# Patient Record
Sex: Female | Born: 1965 | Race: White | Hispanic: No | Marital: Married | Smoking: Former smoker
Health system: Southern US, Community
[De-identification: ages and names within clinical notes are randomized; demographics above are authoritative.]

## PROBLEM LIST (undated history)

## (undated) DIAGNOSIS — R200 Anesthesia of skin: Secondary | ICD-10-CM

## (undated) DIAGNOSIS — I700 Atherosclerosis of aorta: Secondary | ICD-10-CM

## (undated) DIAGNOSIS — M62838 Other muscle spasm: Secondary | ICD-10-CM

## (undated) DIAGNOSIS — E559 Vitamin D deficiency, unspecified: Secondary | ICD-10-CM

## (undated) DIAGNOSIS — E11319 Type 2 diabetes mellitus with unspecified diabetic retinopathy without macular edema: Secondary | ICD-10-CM

## (undated) DIAGNOSIS — K859 Acute pancreatitis without necrosis or infection, unspecified: Secondary | ICD-10-CM

## (undated) DIAGNOSIS — R740 Nonspecific elevation of levels of transaminase and lactic acid dehydrogenase [LDH]: Secondary | ICD-10-CM

## (undated) DIAGNOSIS — E669 Obesity, unspecified: Secondary | ICD-10-CM

## (undated) DIAGNOSIS — E113299 Type 2 diabetes mellitus with mild nonproliferative diabetic retinopathy without macular edema, unspecified eye: Secondary | ICD-10-CM

## (undated) DIAGNOSIS — E785 Hyperlipidemia, unspecified: Secondary | ICD-10-CM

## (undated) DIAGNOSIS — R7401 Elevation of levels of liver transaminase levels: Secondary | ICD-10-CM

## (undated) DIAGNOSIS — R232 Flushing: Secondary | ICD-10-CM

## (undated) HISTORY — DX: Atherosclerosis of aorta: I70.0

## (undated) HISTORY — DX: Elevation of levels of liver transaminase levels: R74.01

## (undated) HISTORY — DX: Nonspecific elevation of levels of transaminase and lactic acid dehydrogenase (ldh): R74.0

## (undated) HISTORY — DX: Anesthesia of skin: R20.0

## (undated) HISTORY — DX: Obesity, unspecified: E66.9

## (undated) HISTORY — DX: Type 2 diabetes mellitus with unspecified diabetic retinopathy without macular edema: E11.319

## (undated) HISTORY — DX: Other muscle spasm: M62.838

## (undated) HISTORY — DX: Vitamin D deficiency, unspecified: E55.9

## (undated) HISTORY — DX: Type 2 diabetes mellitus with mild nonproliferative diabetic retinopathy without macular edema, unspecified eye: E11.3299

## (undated) HISTORY — DX: Hyperlipidemia, unspecified: E78.5

## (undated) HISTORY — DX: Acute pancreatitis without necrosis or infection, unspecified: K85.90

## (undated) HISTORY — DX: Flushing: R23.2

---
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
## 1988-03-28 HISTORY — PX: TUBAL LIGATION: SHX77

## 2004-08-06 ENCOUNTER — Other Ambulatory Visit: Admission: RE | Admit: 2004-08-06 | Discharge: 2004-08-06 | Payer: Self-pay | Admitting: Obstetrics & Gynecology

## 2006-06-01 ENCOUNTER — Ambulatory Visit (HOSPITAL_COMMUNITY): Admission: RE | Admit: 2006-06-01 | Discharge: 2006-06-01 | Payer: Self-pay | Admitting: Family Medicine

## 2006-06-05 ENCOUNTER — Encounter (HOSPITAL_COMMUNITY): Admission: RE | Admit: 2006-06-05 | Discharge: 2006-07-05 | Payer: Self-pay | Admitting: Family Medicine

## 2007-03-26 ENCOUNTER — Emergency Department (HOSPITAL_COMMUNITY): Admission: EM | Admit: 2007-03-26 | Discharge: 2007-03-26 | Payer: Self-pay | Admitting: Emergency Medicine

## 2008-04-15 ENCOUNTER — Ambulatory Visit (HOSPITAL_COMMUNITY): Admission: RE | Admit: 2008-04-15 | Discharge: 2008-04-15 | Payer: Self-pay | Admitting: Family Medicine

## 2010-11-27 ENCOUNTER — Emergency Department: Payer: Self-pay | Admitting: Emergency Medicine

## 2010-12-31 LAB — CBC
Hemoglobin: 12.6
MCV: 87.4
RBC: 4.27
RDW: 15.2

## 2010-12-31 LAB — LIPASE, BLOOD: Lipase: 42

## 2010-12-31 LAB — DIFFERENTIAL
Eosinophils Relative: 1
Lymphocytes Relative: 30
Lymphs Abs: 1.6
Monocytes Absolute: 0.5
Monocytes Relative: 8

## 2010-12-31 LAB — POCT CARDIAC MARKERS
CKMB, poc: <1 — ABNORMAL LOW
Troponin i, poc: <0.05

## 2011-03-29 HISTORY — PX: CERVICAL FUSION: SHX112

## 2011-08-29 DIAGNOSIS — IMO0001 Reserved for inherently not codable concepts without codable children: Secondary | ICD-10-CM | POA: Insufficient documentation

## 2012-01-28 ENCOUNTER — Emergency Department: Payer: Self-pay | Admitting: Emergency Medicine

## 2012-01-31 DIAGNOSIS — M5010 Cervical disc disorder with radiculopathy, unspecified cervical region: Secondary | ICD-10-CM | POA: Insufficient documentation

## 2012-04-20 ENCOUNTER — Emergency Department: Payer: Self-pay | Admitting: Emergency Medicine

## 2012-10-05 DIAGNOSIS — F419 Anxiety disorder, unspecified: Secondary | ICD-10-CM | POA: Insufficient documentation

## 2012-10-08 ENCOUNTER — Encounter: Payer: Self-pay | Admitting: Orthopedic Surgery

## 2012-10-26 ENCOUNTER — Encounter: Payer: Self-pay | Admitting: Orthopedic Surgery

## 2013-03-26 ENCOUNTER — Emergency Department: Payer: Self-pay | Admitting: Emergency Medicine

## 2013-03-26 LAB — COMPREHENSIVE METABOLIC PANEL
Albumin: 4.3 g/dL (ref 3.4–5.0)
Alkaline Phosphatase: 83 U/L
Anion Gap: 6 — ABNORMAL LOW (ref 7–16)
Bilirubin,Total: 0.5 mg/dL (ref 0.2–1.0)
Calcium, Total: 10 mg/dL (ref 8.5–10.1)
Creatinine: 0.83 mg/dL (ref 0.60–1.30)
EGFR (African American): >60
Glucose: 135 mg/dL — ABNORMAL HIGH (ref 65–99)
Osmolality: 271 (ref 275–301)
Sodium: 134 mmol/L — ABNORMAL LOW (ref 136–145)
Total Protein: 8.3 g/dL — ABNORMAL HIGH (ref 6.4–8.2)

## 2013-03-26 LAB — URINALYSIS, COMPLETE
Bilirubin,UR: NEGATIVE
Blood: NEGATIVE
Ph: 5 (ref 4.5–8.0)
Squamous Epithelial: 7

## 2013-03-26 LAB — CBC WITH DIFFERENTIAL/PLATELET
Eosinophil #: 0.4 10*3/uL (ref 0.0–0.7)
Eosinophil %: 5.5 %
HCT: 43.1 % (ref 35.0–47.0)
Lymphocyte #: 3.2 10*3/uL (ref 1.0–3.6)
MCHC: 33.4 g/dL (ref 32.0–36.0)
Neutrophil %: 44.1 %

## 2013-04-06 DIAGNOSIS — N390 Urinary tract infection, site not specified: Secondary | ICD-10-CM | POA: Insufficient documentation

## 2013-04-06 DIAGNOSIS — R10A1 Flank pain, right side: Secondary | ICD-10-CM | POA: Insufficient documentation

## 2013-04-06 DIAGNOSIS — R109 Unspecified abdominal pain: Secondary | ICD-10-CM | POA: Insufficient documentation

## 2013-07-09 ENCOUNTER — Ambulatory Visit: Payer: Self-pay | Admitting: Family Medicine

## 2013-07-09 LAB — CREATININE, SERUM
Creatinine: 0.83 mg/dL (ref 0.60–1.30)
EGFR (African American): >60

## 2013-07-09 LAB — BUN: BUN: 13 mg/dL (ref 7–18)

## 2013-07-29 ENCOUNTER — Ambulatory Visit: Payer: Self-pay | Admitting: Podiatry

## 2014-07-16 DIAGNOSIS — E785 Hyperlipidemia, unspecified: Secondary | ICD-10-CM | POA: Insufficient documentation

## 2014-07-16 DIAGNOSIS — E113299 Type 2 diabetes mellitus with mild nonproliferative diabetic retinopathy without macular edema, unspecified eye: Secondary | ICD-10-CM | POA: Insufficient documentation

## 2014-07-16 DIAGNOSIS — E669 Obesity, unspecified: Secondary | ICD-10-CM | POA: Insufficient documentation

## 2014-07-16 DIAGNOSIS — E11319 Type 2 diabetes mellitus with unspecified diabetic retinopathy without macular edema: Secondary | ICD-10-CM | POA: Insufficient documentation

## 2014-09-15 ENCOUNTER — Other Ambulatory Visit: Payer: Self-pay | Admitting: Family Medicine

## 2014-09-15 NOTE — Telephone Encounter (Signed)
Routing to provider  

## 2014-09-15 NOTE — Telephone Encounter (Signed)
E-Fax came through for refill: Rx: sertraline (ZOLOFT) 100 MG tablet Rx in basket

## 2014-09-17 MED ORDER — SERTRALINE HCL 100 MG PO TABS: 100.0000 mg | ORAL_TABLET | Freq: Every day | ORAL | Status: DC

## 2015-04-07 ENCOUNTER — Other Ambulatory Visit: Payer: Self-pay | Admitting: Family Medicine

## 2015-05-07 ENCOUNTER — Ambulatory Visit (INDEPENDENT_AMBULATORY_CARE_PROVIDER_SITE_OTHER): Payer: Managed Care, Other (non HMO) | Admitting: Family Medicine

## 2015-05-07 ENCOUNTER — Encounter: Payer: Self-pay | Admitting: Family Medicine

## 2015-05-07 VITALS — BP 130/86 | HR 99 | Temp 97.2°F | Ht 69.0 in | Wt 222.0 lb

## 2015-05-07 DIAGNOSIS — Z0000 Encounter for general adult medical examination without abnormal findings: Secondary | ICD-10-CM

## 2015-05-07 DIAGNOSIS — R55 Syncope and collapse: Secondary | ICD-10-CM | POA: Insufficient documentation

## 2015-05-07 DIAGNOSIS — Z1239 Encounter for other screening for malignant neoplasm of breast: Secondary | ICD-10-CM | POA: Diagnosis not present

## 2015-05-07 DIAGNOSIS — Z862 Personal history of diseases of the blood and blood-forming organs and certain disorders involving the immune mechanism: Secondary | ICD-10-CM | POA: Diagnosis not present

## 2015-05-07 DIAGNOSIS — R159 Full incontinence of feces: Secondary | ICD-10-CM | POA: Insufficient documentation

## 2015-05-07 DIAGNOSIS — E11319 Type 2 diabetes mellitus with unspecified diabetic retinopathy without macular edema: Secondary | ICD-10-CM

## 2015-05-07 DIAGNOSIS — E669 Obesity, unspecified: Secondary | ICD-10-CM

## 2015-05-07 DIAGNOSIS — Z23 Encounter for immunization: Secondary | ICD-10-CM

## 2015-05-07 DIAGNOSIS — Z114 Encounter for screening for human immunodeficiency virus [HIV]: Secondary | ICD-10-CM | POA: Insufficient documentation

## 2015-05-07 DIAGNOSIS — R351 Nocturia: Secondary | ICD-10-CM | POA: Insufficient documentation

## 2015-05-07 LAB — UA/M W/RFLX CULTURE, ROUTINE
Bilirubin, UA: NEGATIVE
KETONES UA: NEGATIVE
LEUKOCYTES UA: NEGATIVE
Nitrite, UA: NEGATIVE
PROTEIN UA: NEGATIVE
RBC UA: NEGATIVE
Specific Gravity, UA: 1.015 (ref 1.005–1.030)
Urobilinogen, Ur: 0.2 mg/dL (ref 0.2–1.0)
pH, UA: 5.5 (ref 5.0–7.5)

## 2015-05-07 LAB — BAYER DCA HB A1C WAIVED: HB A1C (BAYER DCA - WAIVED): >14 % — ABNORMAL HIGH (ref <7.0)

## 2015-05-07 LAB — MICROALBUMIN, URINE WAIVED
Creatinine, Urine Waived: 50 mg/dL (ref 10–300)
Microalb, Ur Waived: 80 mg/L — ABNORMAL HIGH (ref 0–19)

## 2015-05-07 MED ORDER — LISINOPRIL 5 MG PO TABS
5.0000 mg | ORAL_TABLET | Freq: Every day | ORAL | Status: DC
Start: 2015-05-07 — End: 2015-06-03

## 2015-05-07 MED ORDER — METFORMIN HCL ER 500 MG PO TB24: 500.0000 mg | ORAL_TABLET | Freq: Every day | ORAL | Status: DC

## 2015-05-07 MED ORDER — SERTRALINE HCL 50 MG PO TABS: ORAL_TABLET | ORAL | Status: DC

## 2015-05-07 MED ORDER — INSULIN DEGLUDEC 100 UNIT/ML ~~LOC~~ SOPN: 10.0000 [IU] | PEN_INJECTOR | Freq: Every day | SUBCUTANEOUS | Status: DC

## 2015-05-07 MED ORDER — LINAGLIPTIN 5 MG PO TABS: 5.0000 mg | ORAL_TABLET | Freq: Every day | ORAL | Status: DC

## 2015-05-07 NOTE — Progress Notes (Signed)
BP 130/86 mmHg  Pulse 99  Temp(Src) 97.2 F (36.2 C)  Ht 5\' 9"  (1.753 m)  Wt 222 lb (100.699 kg)  BMI 32.77 kg/m2  SpO2 96%   Subjective:    Patient ID: Breanna Rogers, female    DOB: 1965-10-06, 50 y.o.   MRN: [id number]  HPI: LETRICIA Rogers is a 50 y.o. female  Chief Complaint  Patient presents with  . Annual Exam    She has been without insurance and has been out of all of her meds for several months. Mammogram card given.  . Diabetes    She has DM eye exam scheduled for 05/18/15. Pneumonia vaccine is UTD.  . Lab    She is willing to be screened for HIV.   She has not taken her medicine in months; she quit her job, lost her insurance; just got her insurance back this month Her stomach was hurting a year ago, has happened twice; her stomach started hurting; went to the bathroom and she couldn't hold it and it just came; like no control over her bowels; another time she was getting his hair cut and it hit her stomach and she got up to ask where the bathroom was and she was incontinent before she could get there; that has happened twice; then last week, she was cooking supper and was peeling her shrimp and was real light-headed; grabbed some water and sat down and felt badly, thought she was going to pass out; she had her head laying on the table; last week; her sweetie said she was white as a ghost; laid on the bed for 15-20 minutes and felt better   No blood in the stool; never had a colonoscopy; her sister had a colonoscopy last year and she had polyps, she is older; no family hx of colon cancer; no inflammatory bowel disease; stool caliber is the same; goes every day, sometimes twice a day; consistency changes, sometimes more loose; not sure if follows diet; no pain with BMs; does have abdominal cramping; she thinks she has lost some weight; appetite is fair; no nausea or vomiting  Her sugars are extremely high; 300s and 400s; not taking any of her diabetes medicines; no chest  pain; gums have been bleeding; no hematuria; some headaches, vision stays blurry; sees eye doctor Feb 20th; might have yeast infection, vaginal irritation; no urinary frequency, nocturia; blood sugar was 323 this morning; metformin upsets her stomach  Relevant past medical, surgical, family and social history reviewed and updated as indicated. Interim medical history since our last visit reviewed. Allergies and medications reviewed and updated.  Review of Systems Per HPI unless specifically indicated above     Objective:    BP 130/86 mmHg  Pulse 99  Temp(Src) 97.2 F (36.2 C)  Ht 5\' 9"  (1.753 m)  Wt 222 lb (100.699 kg)  BMI 32.77 kg/m2  SpO2 96%  Wt Readings from Last 3 Encounters:  05/07/15 222 lb (100.699 kg)  07/16/14 214 lb (97.07 kg)    Physical Exam  Constitutional: She appears well-developed and well-nourished. No distress.  HENT:  Head: Normocephalic and atraumatic.  Eyes: EOM are normal. No scleral icterus.  Neck: No thyromegaly present.  Cardiovascular: Normal rate, regular rhythm and normal heart sounds.   No murmur heard. Pulmonary/Chest: Effort normal and breath sounds normal. No respiratory distress. She has no wheezes.  Abdominal: Soft. Bowel sounds are normal. She exhibits no distension.  Musculoskeletal: Normal range of motion. She exhibits no edema.  Neurological: She is alert. She exhibits normal muscle tone.  Skin: Skin is warm and dry. She is not diaphoretic. No pallor.  Psychiatric: She has a normal mood and affect. Her behavior is normal. Judgment and thought content normal.    Diabetic Foot Form - Detailed   Diabetic Foot Exam - detailed  Diabetic Foot exam was performed with the following findings:  Yes 05/07/2015  8:43 AM  Visual Foot Exam completed.:  Yes  Are the toenails long?:  No  Are the toenails thick?:  Yes  Are the toenails ingrown?:  No    Pulse Foot Exam completed.:  Yes  Right Dorsalis Pedis:  Present Left Dorsalis Pedis:  Present   Sensory Foot Exam Completed.:  Yes  Semmes-Weinstein Monofilament Test  R Site 1-Great Toe:  Pos L Site 1-Great Toe:  Pos  R Site 4:  Pos L Site 4:  Pos  R Site 5:  Pos L Site 5:  Pos    Comments:  Microtrauma effects noted right great toenail with discoloration; toenails are thickened on right great toe, right 2nd toe, left great and 2nd and 5th toes; other toenails appear normal, pink, not thick        Assessment & Plan:   Problem List Items Addressed This Visit      Cardiovascular and Mediastinum   Pre-syncope    Possibly due to extremely high blood sugars; start insulin today; close f/u tomrrow; other labs today; to ER if recurs      Relevant Medications   lisinopril (PRINIVIL,ZESTRIL) 5 MG tablet     Endocrine   Diabetes mellitus type 2 with retinopathy (HCC) - Primary    Check A1c today, urine microalbumin; A1c shows way out of control diabetes, likely as the result of her noncompliance; start insulin; close f/u tomorrow      Relevant Medications   metFORMIN (GLUCOPHAGE XR) 500 MG 24 hr tablet   lisinopril (PRINIVIL,ZESTRIL) 5 MG tablet   Insulin Degludec (TRESIBA FLEXTOUCH) 100 UNIT/ML SOPN   Other Relevant Orders   Bayer DCA Hb A1c Waived (Completed)   Microalbumin, Urine Waived (Completed)     Other   Obesity    Check thyroid, lipids      Relevant Medications   metFORMIN (GLUCOPHAGE XR) 500 MG 24 hr tablet   Insulin Degludec (TRESIBA FLEXTOUCH) 100 UNIT/ML SOPN   Hx of iron deficiency anemia    Check CBC today      Nocturia   Relevant Orders   UA/M w/rflx Culture, Routine (Completed)   Screening for HIV (human immunodeficiency virus)   Relevant Orders   HIV antibody   Breast cancer screening   Relevant Orders   MM DIGITAL SCREENING BILATERAL   Fecal incontinence    Refer to GI to see if large polyp or mass otherwise interfering with outlet, sphincters; doe snot sound like cauda equina based on history      Relevant Orders   Ambulatory referral  to Gastroenterology    Other Visit Diagnoses    Needs flu shot        Encounter for immunization        Relevant Orders    Flu Vaccine QUAD 36+ mos IM    Preventative health care        Relevant Orders    CBC With Differential (Completed)    Comprehensive metabolic panel (Completed)    Lipid Panel w/o Chol/HDL Ratio (Completed)       Follow up plan: Return in about  1 day (around 05/08/2015) for uncontrolled diabetes.  An after-visit summary was printed and given to the patient at [facility].  Please see the patient instructions which may contain other information and recommendations beyond what is mentioned above in the assessment and plan.  Orders Placed This Encounter  Procedures  . MM DIGITAL SCREENING BILATERAL  . Flu Vaccine QUAD 36+ mos IM  . Bayer DCA Hb A1c Waived  . CBC With Differential  . UA/M w/rflx Culture, Routine  . Microalbumin, Urine Waived  . Comprehensive metabolic panel  . Lipid Panel w/o Chol/HDL Ratio  . HIV antibody  . HIV antibody  . Ambulatory referral to Gastroenterology   Meds ordered this encounter  Medications  . metFORMIN (GLUCOPHAGE XR) 500 MG 24 hr tablet    Sig: Take 1 tablet (500 mg total) by mouth daily with supper.    Dispense:  30 tablet    Refill:  2  . DISCONTD: sertraline (ZOLOFT) 50 MG tablet    Sig: One-half of a pill by mouth daily x 6 days, then one whole pill daily    Dispense:  27 tablet    Refill:  0  . DISCONTD: linagliptin (TRADJENTA) 5 MG TABS tablet    Sig: Take 1 tablet (5 mg total) by mouth daily. Reported on 05/07/2015    Dispense:  30 tablet    Refill:  5  . lisinopril (PRINIVIL,ZESTRIL) 5 MG tablet    Sig: Take 1 tablet (5 mg total) by mouth daily.    Dispense:  30 tablet    Refill:  0  . Insulin Degludec (TRESIBA FLEXTOUCH) 100 UNIT/ML SOPN    Sig: Inject 10 Units into the skin daily.    Dispense:  1 pen    Refill:  0

## 2015-05-07 NOTE — Assessment & Plan Note (Addendum)
Check A1c today, urine microalbumin; A1c shows way out of control diabetes, likely as the result of her noncompliance; start insulin; close f/u tomorrow

## 2015-05-07 NOTE — Patient Instructions (Addendum)
You received the flu shot today; it should protect you against the flu virus over the coming months; it will take about two weeks for antibodies to develop; do try to stay away from hospitals, nursing homes, and daycares during peak flu season; taking extra vitamin C daily during flu season may help you avoid getting sick Start back on metformin just once every evening Start back on Tradjenta daily Start back on sertraline daily Start new medicine for kidney protection We'll have you return tomorrow at about this time for your next insulin injection If you have any questions at all about the insulin over the weekend, do NOT give it if unsure and come in on Monday, or go to your pharmacist and they can teach you as well Start back on aspirin 81 mg daily We'll refer you to the gastroenterologist Do follow-up with your eye doctor Consider either vicks vapor rub or dilute vinegar on affected nails and use separate nail clippers to prevent spread to healthy nails Really watch diet Please do call to schedule your mammogram; the number to schedule one at either [facility] or [facility] is [phone]   How and Where to Give Subcutaneous Insulin Injections, Adult People with type 1 diabetes must take insulin since their bodies do not make it. People with type 2 diabetes may require insulin. There are many different types of insulin as well as other injectable diabetes medicines that are meant to be injected into the fat layer under your skin. The type of insulin or injectable diabetes medicine you take may determine how many injections you give yourself and when to take the injections.  CHOOSING A SITE FOR INJECTION Insulin absorption varies from site to site. As with any injectable medication it is best for the insulin to be injected within the same body region. However, do not inject the insulin in the same spot each time. Rotating the spots you give your injections will  prevent inflammation or tissue breakdown. There are four main regions that can be used for injections. The regions include the:  Abdomen (preferred region, especially for non-insulin injectable diabetes medicine).  Front and upper outer sides of thighs.  Back of upper arm.  Buttocks. USING A SYRINGE AND VIAL Drawing up insulin: single insulin dose 1. Wash your hands with soap and water. 2. Gently roll the insulin bottle (vial) between your hands to mix it. Do not shake the vial. 3. Clean the top rubber part of the vial with an alcohol wipe. Be sure that the plastic pop-top has been removed on newer vials. 4. Remove the plastic cover from the needle on the syringe. Do not let the needle touch anything. 5. Pull the plunger back to draw air into the syringe. The air should be the same amount as the insulin dose. 6. Push the needle through the rubber on the top of the vial. Do not turn the vial over. 7. Push the plunger in all the way to put the air into the vial. 8. Leave the needle in the vial and turn the vial and syringe upside down. 9. Pull down slowly on the plunger, drawing the amount of insulin you need into the syringe. 10. Look for air bubbles in the syringe. You may need to push the plunger up and down 2 to 3 times to slowly get rid of any air bubbles in the syringe. 11. Pull back the plunger to get your correct dose. 12. Remove the needle from the vial.  13. Use an alcohol wipe to clean the area of the body to be injected. 14. Pinch up 1 inch of skin and hold it. 15. Put the needle straight into the skin (90-degree angle). Put the needle in as far as it will go (to the hub). The needle may need to be injected at a 45-degree angle in small adults with little fat. 16. When the needle is in, you can let go of your skin. 17. Push the plunger down all the way to inject the insulin. 18. Pull the needle straight out of the skin. 19. Press the alcohol wipe over the spot where you gave  your injection. Keep it there for a few seconds. Do not rub the area. 20. Do not put the plastic cover back on the needle. Drawing up insulin: mixing 2 insulins 1. Wash your hands with soap and water. 2. Gently roll the vial of "cloudy" insulin between your hands or rotate the vial from top to bottom to mix. 3. Clean the top of both vials with an alcohol wipe. Be sure that the plastic pop-top lid has been removed on newer vials. 4. Pull air into the syringe to equal the dose of "cloudy" insulin. 5. Stick the needle into the "cloudy" insulin vial and inject the air. Be sure to keep the vial upright. 6. Remove the needle from the "cloudy" insulin vial. 7. Pull air into the syringe to equal the dose of "clear" insulin. 8. Stick the needle into the "clear" insulin vial and inject the air. 9. Leave the needle in the "clear" insulin vial and turn the vial upside down. 10. Pull down on the plunger and slowly draw into the syringe the number of units of "clear" insulin desired. 11. Look for air bubbles in the syringe. You may need to push the plunger up and down 2 to 3 times to slowly get rid of any air bubbles in the syringe. 12. Remove the needle from the "clear" insulin vial. 13. Stick the needle into the "cloudy" insulin vial. Do not inject any of the "clear" insulin into the "cloudy" vial. 14. Turn the "cloudy" vial upside down and pull the plunger down to the number of units that equals the total number of units of "clear" and "cloudy" insulins. 15. Remove the needle from the "cloudy" insulin vial. 16. Use an alcohol wipe to clean the area of the body to be injected. 17. Put the needle straight into the skin (90-degree angle). Put the needle in as far as it will go (to the hub). The needle may need to be injected at a 45-degree angle in small adults with little fat. 18. When the needle is in, you can let go of your skin. 19. Push the plunger down all the way to inject the insulin. 20. Pull the  needle straight out of the skin. 21. Press the alcohol wipe over the spot where you gave your injection. Keep it there for a few seconds. Do not rub the area. 22. Do not put the plastic cover back on the needle. USING INSULIN PENS 1. Wash your hands with soap and water. 2. If you are using the "cloudy" insulin, roll the pen between your palms several times or rotate the pen top to bottom several times. 3. Remove the insulin pen cap. 4. Clean the rubber stopper of the cartridge with an alcohol wipe. 5. Remove the protective paper tab from the disposable needle. 6. Screw the needle onto the pen. 7. Remove the outer  plastic needle cover. 8. Remove the inner plastic needle cover. 9. Prime the insulin pen by turning the button (dial) to 2 units. Hold the pen with the needle pointing up, and push the dial on the opposite end until a drop of insulin appears at the needle tip. If no insulin appears, repeat this step. 10. Dial the number of units of insulin you will inject. 11. Use an alcohol wipe to clean the area of the body to be injected. 12. Pinch up 1 inch of skin and hold it. 13. Put the needle straight into the skin (90-degree angle). 14. Push the dial down to push the insulin into the fat tissue. 15. Count to 10 slowly. Then, remove the needle from the fat tissue. 16. Carefully replace the larger outer plastic needle cover over the needle and unscrew the capped needle. THROWING AWAY SUPPLIES  Discard used needles in a puncture proof sharps disposal container. Follow disposal regulations for the area where you live.  Vials and empty disposable pens may be thrown away in the regular trash.   This information is not intended to replace advice given to you by your health care provider. Make sure you discuss any questions you have with your health care provider.   Document Released: 06/04/2003 Document Revised: 04/04/2014 Document Reviewed: 08/21/2012 Elsevier Interactive Patient Education  [employer].

## 2015-05-07 NOTE — Assessment & Plan Note (Signed)
Check thyroid, lipids

## 2015-05-08 ENCOUNTER — Ambulatory Visit: Payer: Managed Care, Other (non HMO) | Admitting: Family Medicine

## 2015-05-08 LAB — CBC WITH DIFFERENTIAL
BASOS: 1 %
Basophils Absolute: 0.1 10*3/uL (ref 0.0–0.2)
EOS (ABSOLUTE): 0.9 10*3/uL — ABNORMAL HIGH (ref 0.0–0.4)
EOS: 14 %
HEMATOCRIT: 44.4 % (ref 34.0–46.6)
HEMOGLOBIN: 14.7 g/dL (ref 11.1–15.9)
IMMATURE GRANS (ABS): 0 10*3/uL (ref 0.0–0.1)
Immature Granulocytes: 0 %
LYMPHS: 30 %
Lymphocytes Absolute: 2 10*3/uL (ref 0.7–3.1)
MCH: 29.5 pg (ref 26.6–33.0)
MCHC: 33.1 g/dL (ref 31.5–35.7)
MCV: 89 fL (ref 79–97)
MONOCYTES: 7 %
Monocytes Absolute: 0.4 10*3/uL (ref 0.1–0.9)
NEUTROS ABS: 3.3 10*3/uL (ref 1.4–7.0)
Neutrophils: 48 %
RBC: 4.98 x10E6/uL (ref 3.77–5.28)
RDW: 13.5 % (ref 12.3–15.4)
WBC: 6.8 10*3/uL (ref 3.4–10.8)

## 2015-05-08 LAB — LIPID PANEL W/O CHOL/HDL RATIO
Cholesterol, Total: 239 mg/dL — ABNORMAL HIGH (ref 100–199)
HDL: 42 mg/dL (ref >39)
LDL Calculated: 144 mg/dL — ABNORMAL HIGH (ref 0–99)
Triglycerides: 266 mg/dL — ABNORMAL HIGH (ref 0–149)
VLDL Cholesterol Cal: 53 mg/dL — ABNORMAL HIGH (ref 5–40)

## 2015-05-08 LAB — COMPREHENSIVE METABOLIC PANEL
A/G RATIO: 1.5 (ref 1.1–2.5)
ALBUMIN: 4.6 g/dL (ref 3.5–5.5)
ALT: 25 IU/L (ref 0–32)
AST: 21 IU/L (ref 0–40)
Alkaline Phosphatase: 91 IU/L (ref 39–117)
BILIRUBIN TOTAL: 0.6 mg/dL (ref 0.0–1.2)
BUN / CREAT RATIO: 18 (ref 9–23)
BUN: 13 mg/dL (ref 6–24)
CALCIUM: 9.9 mg/dL (ref 8.7–10.2)
CHLORIDE: 93 mmol/L — AB (ref 96–106)
CO2: 23 mmol/L (ref 18–29)
Creatinine, Ser: 0.74 mg/dL (ref 0.57–1.00)
GFR, EST AFRICAN AMERICAN: 110 mL/min/{1.73_m2} (ref >59)
GFR, EST NON AFRICAN AMERICAN: 95 mL/min/{1.73_m2} (ref >59)
Globulin, Total: 3 g/dL (ref 1.5–4.5)
Glucose: 351 mg/dL — ABNORMAL HIGH (ref 65–99)
POTASSIUM: 4.5 mmol/L (ref 3.5–5.2)
Sodium: 137 mmol/L (ref 134–144)
TOTAL PROTEIN: 7.6 g/dL (ref 6.0–8.5)

## 2015-05-08 LAB — HIV ANTIBODY (ROUTINE TESTING W REFLEX): HIV SCREEN 4TH GENERATION: NONREACTIVE

## 2015-05-12 ENCOUNTER — Telehealth: Payer: Self-pay

## 2015-05-12 MED ORDER — SITAGLIPTIN PHOSPHATE 100 MG PO TABS: 100.0000 mg | ORAL_TABLET | Freq: Every day | ORAL | Status: DC

## 2015-05-12 NOTE — Telephone Encounter (Signed)
Patient's Prior Authorization for [facility] was denied.   Reason: Formularies must have a contradiction or reaction ([place], [place]) first.

## 2015-05-12 NOTE — Telephone Encounter (Signed)
Let pt know we're going to try Januvia instead; new Rx sent to pharmacy

## 2015-05-13 ENCOUNTER — Encounter: Payer: Self-pay | Admitting: Family Medicine

## 2015-05-13 NOTE — Telephone Encounter (Signed)
Patient notified

## 2015-05-14 MED ORDER — COLESEVELAM HCL 625 MG PO TABS: 1875.0000 mg | ORAL_TABLET | Freq: Two times a day (BID) | ORAL | Status: DC

## 2015-05-18 NOTE — Telephone Encounter (Signed)
Please check on GI referral Patient sent me a message, she has not heard back Thanks

## 2015-05-19 NOTE — Telephone Encounter (Signed)
Referral was generated to [facility] in [place].

## 2015-05-21 MED ORDER — SERTRALINE HCL 50 MG PO TABS: 75.0000 mg | ORAL_TABLET | Freq: Every day | ORAL | Status: DC

## 2015-05-21 NOTE — Telephone Encounter (Signed)
Yes, referral was generated, but patient has not heard anything back per her MyChart note to me Please contact Pat Patrick and patient and make sure appt is made and she's aware; thank you

## 2015-05-21 NOTE — Addendum Note (Signed)
Addended by: Hiyab Nhem, Satira Anis on: 05/21/2015 11:19 AM   Modules accepted: Orders

## 2015-05-21 NOTE — Telephone Encounter (Signed)
They have tried to call patient so far 1x per referral.  They have to referral and are working on it. Have reached out to the patient and will continue to.

## 2015-05-23 NOTE — Assessment & Plan Note (Signed)
Check CBC today.  

## 2015-05-23 NOTE — Assessment & Plan Note (Signed)
Refer to GI to see if large polyp or mass otherwise interfering with outlet, sphincters; doe snot sound like cauda equina based on history

## 2015-05-23 NOTE — Assessment & Plan Note (Signed)
Possibly due to extremely high blood sugars; start insulin today; close f/u tomrrow; other labs today; to ER if recurs

## 2015-05-24 ENCOUNTER — Encounter: Payer: Self-pay | Admitting: Gastroenterology

## 2015-05-24 NOTE — Telephone Encounter (Signed)
error 

## 2015-06-03 ENCOUNTER — Other Ambulatory Visit: Payer: Self-pay | Admitting: Family Medicine

## 2015-06-03 DIAGNOSIS — E11319 Type 2 diabetes mellitus with unspecified diabetic retinopathy without macular edema: Secondary | ICD-10-CM

## 2015-06-03 MED ORDER — LISINOPRIL 5 MG PO TABS: 5.0000 mg | ORAL_TABLET | Freq: Every day | ORAL | Status: DC

## 2015-06-03 MED ORDER — SERTRALINE HCL 50 MG PO TABS: 75.0000 mg | ORAL_TABLET | Freq: Every day | ORAL | Status: DC

## 2015-06-03 NOTE — Telephone Encounter (Signed)
Patient needs an appt please for her diabetes; I want to see how her sugars are doing and how her mood is doing on higher dose of sertraline appt within the week please

## 2015-06-04 NOTE — Telephone Encounter (Signed)
Patient has appt 06/12/15

## 2015-06-12 ENCOUNTER — Encounter: Payer: Self-pay | Admitting: Family Medicine

## 2015-06-12 ENCOUNTER — Ambulatory Visit (INDEPENDENT_AMBULATORY_CARE_PROVIDER_SITE_OTHER): Payer: Managed Care, Other (non HMO) | Admitting: Family Medicine

## 2015-06-12 VITALS — BP 119/77 | HR 102 | Temp 98.7°F | Wt 228.0 lb

## 2015-06-12 DIAGNOSIS — E785 Hyperlipidemia, unspecified: Secondary | ICD-10-CM | POA: Insufficient documentation

## 2015-06-12 DIAGNOSIS — E669 Obesity, unspecified: Secondary | ICD-10-CM | POA: Diagnosis not present

## 2015-06-12 DIAGNOSIS — E11319 Type 2 diabetes mellitus with unspecified diabetic retinopathy without macular edema: Secondary | ICD-10-CM

## 2015-06-12 MED ORDER — COLESEVELAM HCL 625 MG PO TABS: 625.0000 mg | ORAL_TABLET | Freq: Two times a day (BID) | ORAL | Status: DC

## 2015-06-12 MED ORDER — METFORMIN HCL ER 500 MG PO TB24: ORAL_TABLET | ORAL | Status: DC

## 2015-06-12 MED ORDER — SERTRALINE HCL 100 MG PO TABS: 100.0000 mg | ORAL_TABLET | Freq: Every day | ORAL | Status: DC

## 2015-06-12 NOTE — Assessment & Plan Note (Addendum)
Suspect insulin as part of cause of weight gain since she is trying so hard; encouragement given; titrate up metformin, and will hope to decrease insulin as sugars improve

## 2015-06-12 NOTE — Progress Notes (Signed)
BP 119/77 mmHg  Pulse 102  Temp(Src) 98.7 F (37.1 C)  Wt 228 lb (103.42 kg)  SpO2 97%   Subjective:    Patient ID: Breanna Rogers, female    DOB: [birth year], 50 y.o.   MRN: [id number]  HPI: Breanna Rogers is a 50 y.o. female  Chief Complaint  Patient presents with  . Diabetes    follow up, she has eye exam scheduled for next month  . Hyperlipidemia    follow up and labs, she is only taking 1 tab BID of the Welchol   Patient had lost her insurance was lost to follow-up for many months, off of medicine; she is back on now and her blood sugars are much, much better; blood sugars 140 after work sometimes; lowest blood sugar over last 1-2 weeks 140; highest blood sugar in the last week or two was 293 after cheesecake; has really cut out sweets for the most part; has sugar-free Jello and pudding; higher sugars in the morning than when she goes to bed; diet drinks; half sweet and half unsweet tea; no abdominal pain or nausea  She has gone up to 75 mg of sertraline; taking for hot flashes; also having some anxiety; she does not think she is depressed; not down and crying or tearful; does get short-fused; so happy in relationship and happy with job  Cholesterol; currently taking just one Welchol twice a day; not much bacon or sausage or any of that; if she has a salad, she does the balsalmic vinegar, not the heavy salad dressing  Relevant past medical, surgical, family and social history reviewed and updated as indicated Past Medical History  Diagnosis Date  . Diabetes mellitus type 2 with retinopathy ([place])   . 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 Hyperlipidemia   . Obesity   . Vitamin D deficiency   . Elevated transaminase level   . Hot flashes   . Spasm of muscle   . Numbness of foot    Past Surgical History  Procedure Laterality Date  . Tonsillectomy and adenoidectomy  1976  . Tubal ligation  1990  . Cervical fusion  2013   C5-7 at [facility], (ACDF C5-6 with removal of hardware)   Family History  Problem Relation Age of Onset  . Cancer Mother     cervical  . Asthma Mother   . Diabetes Mother   . Heart disease Mother   . Hyperlipidemia Mother   . Heart disease Father   . Hyperlipidemia Father   . Hypertension Father   . Diabetes Sister   . Thyroid disease Sister   . Cancer Sister   . Diabetes Brother   . Hypertension Brother   . Stroke Neg Hx   . COPD Sister    Interim medical history since our last visit reviewed. Allergies and medications reviewed and updated.  Review of Systems Per HPI unless specifically indicated above     Objective:    BP 119/77 mmHg  Pulse 102  Temp(Src) 98.7 F (37.1 C)  Wt 228 lb (103.42 kg)  SpO2 97%  Wt Readings from Last 3 Encounters:  06/12/15 228 lb (103.42 kg)  05/07/15 222 lb (100.699 kg)  07/16/14 214 lb (97.07 kg)    Physical Exam  Constitutional: She appears well-developed and well-nourished. No distress.  Weight gain noted  HENT:  Head: Normocephalic and atraumatic.  Eyes: EOM are normal. No scleral icterus.  Neck: No thyromegaly  present.  Cardiovascular: Normal rate, regular rhythm and normal heart sounds.   No murmur heard. Rate under 100 during auscultation  Pulmonary/Chest: Effort normal and breath sounds normal. No respiratory distress. She has no wheezes.  Abdominal: Soft. Bowel sounds are normal. She exhibits no distension.  Musculoskeletal: Normal range of motion. She exhibits no edema.  Neurological: She is alert. She exhibits normal muscle tone.  Skin: Skin is warm and dry. She is not diaphoretic. No pallor.  Psychiatric: She has a normal mood and affect. Her behavior is normal. Judgment and thought content normal.    Diabetic Foot Form - Detailed   Diabetic Foot Exam - detailed  Diabetic Foot exam was performed with the following findings:  Yes 06/12/2015  4:30 PM  Visual Foot Exam completed.:  Yes  Can the patient see the bottom of  their feet?:  Yes  Are the toenails long?:  No  Are the toenails thick?:  No  Are the toenails ingrown?:  No    Pulse Foot Exam completed.:  Yes  Right Dorsalis Pedis:  Present Left Dorsalis Pedis:  Present  Semmes-Weinstein Monofilament Test  R Site 1-Great Toe:  Pos L Site 1-Great Toe:  Pos  R Site 4:  Pos L Site 4:  Pos  R Site 5:  Pos L Site 5:  Pos        Results for orders placed or performed in visit on 05/07/15  Bayer DCA Hb A1c Waived  Result Value Ref Range   Bayer DCA Hb A1c Waived >14.0 (H) <7.0 %  CBC With Differential  Result Value Ref Range   WBC 6.8 3.4 - 10.8 x10E3/uL   RBC 4.98 3.77 - 5.28 x10E6/uL   Hemoglobin 14.7 11.1 - 15.9 g/dL   Hematocrit 44.4 34.0 - 46.6 %   MCV 89 79 - 97 fL   MCH 29.5 26.6 - 33.0 pg   MCHC 33.1 31.5 - 35.7 g/dL   RDW 13.5 12.3 - 15.4 %   Neutrophils 48 %   Lymphs 30 %   Monocytes 7 %   Eos 14 %   Basos 1 %   Neutrophils Absolute 3.3 1.4 - 7.0 x10E3/uL   Lymphocytes Absolute 2.0 0.7 - 3.1 x10E3/uL   Monocytes Absolute 0.4 0.1 - 0.9 x10E3/uL   EOS (ABSOLUTE) 0.9 (H) 0.0 - 0.4 x10E3/uL   Basophils Absolute 0.1 0.0 - 0.2 x10E3/uL   Immature Granulocytes 0 %   Immature Grans (Abs) 0.0 0.0 - 0.1 x10E3/uL  UA/M w/rflx Culture, Routine  Result Value Ref Range   Specific Gravity, UA 1.015 1.005 - 1.030   pH, UA 5.5 5.0 - 7.5   Color, UA Yellow Yellow   Appearance Ur Clear Clear   Leukocytes, UA Negative Negative   Protein, UA Negative Negative/Trace   Glucose, UA 3+ (A) Negative   Ketones, UA Negative Negative   RBC, UA Negative Negative   Bilirubin, UA Negative Negative   Urobilinogen, Ur 0.2 0.2 - 1.0 mg/dL   Nitrite, UA Negative Negative  Microalbumin, Urine Waived  Result Value Ref Range   Microalb, Ur Waived 80 (H) 0 - 19 mg/L   Creatinine, Urine Waived 50 10 - 300 mg/dL   Microalb/Creat Ratio 30-300 (H) <30 mg/g  Comprehensive metabolic panel  Result Value Ref Range   Glucose 351 (H) 65 - 99 mg/dL   BUN 13 6 - 24  mg/dL   Creatinine, Ser 0.74 0.57 - 1.00 mg/dL   GFR calc non Af Wyvonnia Lora  95 >59 mL/min/1.73   GFR calc Af Amer 110 >59 mL/min/1.73   BUN/Creatinine Ratio 18 9 - 23   Sodium 137 134 - 144 mmol/L   Potassium 4.5 3.5 - 5.2 mmol/L   Chloride 93 (L) 96 - 106 mmol/L   CO2 23 18 - 29 mmol/L   Calcium 9.9 8.7 - 10.2 mg/dL   Total Protein 7.6 6.0 - 8.5 g/dL   Albumin 4.6 3.5 - 5.5 g/dL   Globulin, Total 3.0 1.5 - 4.5 g/dL   Albumin/Globulin Ratio 1.5 1.1 - 2.5   Bilirubin Total 0.6 0.0 - 1.2 mg/dL   Alkaline Phosphatase 91 39 - 117 IU/L   AST 21 0 - 40 IU/L   ALT 25 0 - 32 IU/L  Lipid Panel w/o Chol/HDL Ratio  Result Value Ref Range   Cholesterol, Total 239 (H) 100 - 199 mg/dL   Triglycerides 266 (H) 0 - 149 mg/dL   HDL 42 >39 mg/dL   VLDL Cholesterol Cal 53 (H) 5 - 40 mg/dL   LDL Calculated 144 (H) 0 - 99 mg/dL  HIV antibody  Result Value Ref Range   HIV Screen 4th Generation wRfx Non Reactive Non Reactive      Assessment & Plan:   Problem List Items Addressed This Visit      Endocrine   Diabetes mellitus type 2 with retinopathy ([place]) - Primary    Patient had been lost to f/u; did not have insurance; now back on medicines; blood sugars much better; titrate up metformin; will hope to be able to bring down the insulin; also on Welchol to help sugars as well; she is eating much better; weight loss would also help; foot exam by MD today      Relevant Medications   aspirin EC 81 MG tablet   metFORMIN (GLUCOPHAGE XR) 500 MG 24 hr tablet     Other   Obesity    Suspect insulin as part of cause of weight gain since she is trying so hard; encouragement given; titrate up metformin, and will hope to decrease insulin as sugars improve      Relevant Medications   metFORMIN (GLUCOPHAGE XR) 500 MG 24 hr tablet   Dyslipidemia    She did not tolerate statin previously; now on Welchol; consider rechallenge with statin in May; labs in May (fasting)      Relevant Medications   colesevelam  (WELCHOL) 625 MG tablet      Follow up plan: Return in about 8 weeks (around 08/04/2015) for fasting labs and visit; [facility].  Meds ordered this encounter  Medications  . cetirizine (ZYRTEC) 10 MG tablet    Sig: Take 10 mg by mouth daily.  Marland Kitchen aspirin EC 81 MG tablet    Sig: Take 81 mg by mouth daily.  . metFORMIN (GLUCOPHAGE XR) 500 MG 24 hr tablet    Sig: Two pills by mouth every evening Mar 17-21, then three pills every evening Mar 22-26, then four pills every evening    Dispense:  110 tablet    Refill:  0  . sertraline (ZOLOFT) 100 MG tablet    Sig: Take 1 tablet (100 mg total) by mouth daily.    Dispense:  30 tablet    Refill:  11    Changing dose  . colesevelam (WELCHOL) 625 MG tablet    Sig: Take 1 tablet (625 mg total) by mouth 2 (two) times daily with a meal. Recheck labs in 6 weeks    Dispense:  60  tablet    Refill:  5    Changing dose   An after-visit summary was printed and given to the patient at [place].  Please see the patient instructions which may contain other information and recommendations beyond what is mentioned above in the assessment and plan.

## 2015-06-12 NOTE — Patient Instructions (Addendum)
Increase your metformin gradually by one pill every five days to four pills every night, start with two pills tonight and take two pills nightly March 17-22, then three pills nightly Mar 23-27, then four pills nightly Increase sertraline to 100 mg daily As sugars come down, we'll decrease and then stop your insulin (work with me on that), call me and we'll adjust We'll see you in early May then for labs and visit, but call sooner if needed

## 2015-06-25 NOTE — Assessment & Plan Note (Signed)
She did not tolerate statin previously; now on Welchol; consider rechallenge with statin in May; labs in May (fasting)

## 2015-06-25 NOTE — Assessment & Plan Note (Signed)
Patient had been lost to f/u; did not have insurance; now back on medicines; blood sugars much better; titrate up metformin; will hope to be able to bring down the insulin; also on Welchol to help sugars as well; she is eating much better; weight loss would also help; foot exam by MD today

## 2015-06-26 ENCOUNTER — Other Ambulatory Visit: Payer: Self-pay | Admitting: Family Medicine

## 2015-06-26 DIAGNOSIS — Z5181 Encounter for therapeutic drug level monitoring: Secondary | ICD-10-CM

## 2015-06-26 NOTE — Telephone Encounter (Signed)
Please ask patient to have a BMP done first week of April if she doesn't mind; we didn't talk about it at her appt, but I'd like to monitor her kidney function and K+ on the lisinopril; we'll still do the glucose and cholesterol panel in May as we discussed; she does not need to see me personally, just lab drawn (non-fasting) Rx sent as requested

## 2015-07-08 ENCOUNTER — Ambulatory Visit (INDEPENDENT_AMBULATORY_CARE_PROVIDER_SITE_OTHER): Payer: Managed Care, Other (non HMO) | Admitting: Family Medicine

## 2015-07-08 ENCOUNTER — Encounter: Payer: Self-pay | Admitting: Family Medicine

## 2015-07-08 VITALS — BP 110/80 | HR 93 | Temp 98.4°F | Resp 14 | Wt 227.0 lb

## 2015-07-08 DIAGNOSIS — E11319 Type 2 diabetes mellitus with unspecified diabetic retinopathy without macular edema: Secondary | ICD-10-CM

## 2015-07-08 DIAGNOSIS — E785 Hyperlipidemia, unspecified: Secondary | ICD-10-CM | POA: Diagnosis not present

## 2015-07-08 DIAGNOSIS — Z5181 Encounter for therapeutic drug level monitoring: Secondary | ICD-10-CM

## 2015-07-08 DIAGNOSIS — R809 Proteinuria, unspecified: Secondary | ICD-10-CM | POA: Insufficient documentation

## 2015-07-08 DIAGNOSIS — F320 Major depressive disorder, single episode, mild: Secondary | ICD-10-CM | POA: Diagnosis not present

## 2015-07-08 MED ORDER — SERTRALINE HCL 100 MG PO TABS: 150.0000 mg | ORAL_TABLET | Freq: Every day | ORAL | Status: DC

## 2015-07-08 MED ORDER — ATORVASTATIN CALCIUM 10 MG PO TABS: 10.0000 mg | ORAL_TABLET | Freq: Every day | ORAL | Status: DC

## 2015-07-08 MED ORDER — BLOOD GLUCOSE MONITOR KIT: PACK | Status: DC

## 2015-07-08 NOTE — Assessment & Plan Note (Signed)
Increase sertraline from 100 mg to 150 mg daily; call if any problems prior to next appt

## 2015-07-08 NOTE — Assessment & Plan Note (Signed)
Will check sgpt at f/u in May on the new statin

## 2015-07-08 NOTE — Assessment & Plan Note (Signed)
She is willing to try statin again; will start lower dose 10 mg atorvastatin instead of 40 mg at first; recheck lipids and sgpt in May at next visit; consider CK if significant muscle aches; goal LDL under 100; she did not tolerate welchol due to GI side effects; healthy eating encouraged, diet low in saturated fats and more whole grains

## 2015-07-08 NOTE — Assessment & Plan Note (Signed)
Patient's sugars sound to be under much better control; due for A1c next month; will hopefully be able to stop the insulin soon; continue to work on diet, healthy eating; check feet every night; foot exam by MD today

## 2015-07-08 NOTE — Progress Notes (Signed)
BP 110/80 mmHg  Pulse 93  Temp(Src) 98.4 F (36.9 C) (Oral)  Resp 14  Wt 227 lb (102.967 kg)  SpO2 94%   Subjective:    Patient ID: Breanna Rogers, female    DOB: 19-Jul-1965, 50 y.o.   MRN: [id number]  HPI: Breanna Rogers is a 50 y.o. female  Chief Complaint  Patient presents with  . Medication Refill  . Diabetes    Checks glucose 2x per day low-140, high-190's  . Hypertension  . Hyperlipidemia    patient states has quit taking whelchol  . Depression   Type 2 diabetes; mornings fasting are 140-160; no lows; also checking in the afternoon and before supper; 140-ish; doing a better job with eating; checking feet every night; no problems with sores; has a little numbness under toes; next eye exam is coming up next week; up to four pills metformin; using ten units of insulin daily  She did not tolerate Welchol, upset stomach; hardly eats any eggs; not much processed pork; gets whole grains, multigrain bread; she had taken statins before and got achy but she is willing to try one again; sister had same issue; took statin, got achy, off for a while, then took them again and she did fine  HTN; controlled today  Depression; mood has improved a little bit, but there is room for improvement; no dark thoughts; no HI/SI; no side effects from the sertraline  Relevant past medical, surgical, family and social history reviewed Past Medical History  Diagnosis Date  . Diabetes mellitus type 2 with retinopathy ([place])   . 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 Hyperlipidemia   . Obesity   . Vitamin D deficiency   . Elevated transaminase level   . Hot flashes   . Spasm of muscle   . Numbness of foot    Past Surgical History  Procedure Laterality Date  . Tonsillectomy and adenoidectomy  1976  . Tubal ligation  1990  . Cervical fusion  2013    C5-7 at [facility], (ACDF C5-6 with removal of hardware)   Family History  Problem Relation Age  of Onset  . Cancer Mother     cervical  . Asthma Mother   . Diabetes Mother   . Heart disease Mother   . Hyperlipidemia Mother   . Heart disease Father   . Hyperlipidemia Father   . Hypertension Father   . Diabetes Sister   . Thyroid disease Sister   . Cancer Sister   . Diabetes Brother   . Hypertension Brother   . Stroke Neg Hx   . COPD Sister    Social History  Substance Use Topics  . Smoking status: Former Smoker -- 1.00 packs/day for 35 years    Types: Cigarettes    Quit date: 03/28/2010  . Smokeless tobacco: Never Used  . Alcohol Use: No   S/P BTL for contraception Interim medical history since our last visit reviewed. Allergies and medications reviewed and updated.  Review of Systems  Per HPI unless specifically indicated above     Objective:    BP 110/80 mmHg  Pulse 93  Temp(Src) 98.4 F (36.9 C) (Oral)  Resp 14  Wt 227 lb (102.967 kg)  SpO2 94%  Wt Readings from Last 3 Encounters:  07/08/15 227 lb (102.967 kg)  06/12/15 228 lb (103.42 kg)  05/07/15 222 lb (100.699 kg)    Physical Exam  Constitutional: She appears well-developed  and well-nourished. No distress.  Weight down one pound  HENT:  Head: Normocephalic and atraumatic.  Eyes: EOM are normal. No scleral icterus.  Neck: No thyromegaly present.  Cardiovascular: Normal rate, regular rhythm and normal heart sounds.   No murmur heard. Pulmonary/Chest: Effort normal and breath sounds normal. No respiratory distress. She has no wheezes.  Abdominal: Soft. She exhibits no distension.  Musculoskeletal: Normal range of motion. She exhibits no edema.  Neurological: She is alert.  Skin: Skin is warm and dry. She is not diaphoretic. No pallor.  Psychiatric: She has a normal mood and affect. Her behavior is normal. Judgment and thought content normal.   Diabetic Foot Form - Detailed   Diabetic Foot Exam - detailed  Diabetic Foot exam was performed with the following findings:  Yes 07/08/2015  9:41 AM    Visual Foot Exam completed.:  Yes  Are the toenails long?:  No  Are the toenails thick?:  No  Are the toenails ingrown?:  No  Normal Range of Motion:  Yes    Pulse Foot Exam completed.:  Yes  Right Dorsalis Pedis:  Present Left Dorsalis Pedis:  Present  Sensory Foot Exam Completed.:  Yes  Swelling:  No  Semmes-Weinstein Monofilament Test  R Site 1-Great Toe:  Pos L Site 1-Great Toe:  Pos  R Site 4:  Pos L Site 4:  Pos  R Site 5:  Pos L Site 5:  Pos       Results for orders placed or performed in visit on 05/07/15  Bayer DCA Hb A1c Waived  Result Value Ref Range   Bayer DCA Hb A1c Waived >14.0 (H) <7.0 %  CBC With Differential  Result Value Ref Range   WBC 6.8 3.4 - 10.8 x10E3/uL   RBC 4.98 3.77 - 5.28 x10E6/uL   Hemoglobin 14.7 11.1 - 15.9 g/dL   Hematocrit 44.4 34.0 - 46.6 %   MCV 89 79 - 97 fL   MCH 29.5 26.6 - 33.0 pg   MCHC 33.1 31.5 - 35.7 g/dL   RDW 13.5 12.3 - 15.4 %   Neutrophils 48 %   Lymphs 30 %   Monocytes 7 %   Eos 14 %   Basos 1 %   Neutrophils Absolute 3.3 1.4 - 7.0 x10E3/uL   Lymphocytes Absolute 2.0 0.7 - 3.1 x10E3/uL   Monocytes Absolute 0.4 0.1 - 0.9 x10E3/uL   EOS (ABSOLUTE) 0.9 (H) 0.0 - 0.4 x10E3/uL   Basophils Absolute 0.1 0.0 - 0.2 x10E3/uL   Immature Granulocytes 0 %   Immature Grans (Abs) 0.0 0.0 - 0.1 x10E3/uL  UA/M w/rflx Culture, Routine  Result Value Ref Range   Specific Gravity, UA 1.015 1.005 - 1.030   pH, UA 5.5 5.0 - 7.5   Color, UA Yellow Yellow   Appearance Ur Clear Clear   Leukocytes, UA Negative Negative   Protein, UA Negative Negative/Trace   Glucose, UA 3+ (A) Negative   Ketones, UA Negative Negative   RBC, UA Negative Negative   Bilirubin, UA Negative Negative   Urobilinogen, Ur 0.2 0.2 - 1.0 mg/dL   Nitrite, UA Negative Negative  Microalbumin, Urine Waived  Result Value Ref Range   Microalb, Ur Waived 80 (H) 0 - 19 mg/L   Creatinine, Urine Waived 50 10 - 300 mg/dL   Microalb/Creat Ratio 30-300 (H) <30 mg/g   Comprehensive metabolic panel  Result Value Ref Range   Glucose 351 (H) 65 - 99 mg/dL   BUN 13 6 -  24 mg/dL   Creatinine, Ser 0.74 0.57 - 1.00 mg/dL   GFR calc non Af Amer 95 >59 mL/min/1.73   GFR calc Af Amer 110 >59 mL/min/1.73   BUN/Creatinine Ratio 18 9 - 23   Sodium 137 134 - 144 mmol/L   Potassium 4.5 3.5 - 5.2 mmol/L   Chloride 93 (L) 96 - 106 mmol/L   CO2 23 18 - 29 mmol/L   Calcium 9.9 8.7 - 10.2 mg/dL   Total Protein 7.6 6.0 - 8.5 g/dL   Albumin 4.6 3.5 - 5.5 g/dL   Globulin, Total 3.0 1.5 - 4.5 g/dL   Albumin/Globulin Ratio 1.5 1.1 - 2.5   Bilirubin Total 0.6 0.0 - 1.2 mg/dL   Alkaline Phosphatase 91 39 - 117 IU/L   AST 21 0 - 40 IU/L   ALT 25 0 - 32 IU/L  Lipid Panel w/o Chol/HDL Ratio  Result Value Ref Range   Cholesterol, Total 239 (H) 100 - 199 mg/dL   Triglycerides 266 (H) 0 - 149 mg/dL   HDL 42 >39 mg/dL   VLDL Cholesterol Cal 53 (H) 5 - 40 mg/dL   LDL Calculated 144 (H) 0 - 99 mg/dL  HIV antibody  Result Value Ref Range   HIV Screen 4th Generation wRfx Non Reactive Non Reactive      Assessment & Plan:   Problem List Items Addressed This Visit      Endocrine   Diabetes mellitus type 2 with retinopathy ([place]) - Primary    Patient's sugars sound to be under much better control; due for A1c next month; will hopefully be able to stop the insulin soon; continue to work on diet, healthy eating; check feet every night; foot exam by MD today      Relevant Medications   atorvastatin (LIPITOR) 10 MG tablet     Other   Dyslipidemia    She is willing to try statin again; will start lower dose 10 mg atorvastatin instead of 40 mg at first; recheck lipids and sgpt in May at next visit; consider CK if significant muscle aches; goal LDL under 100; she did not tolerate welchol due to GI side effects; healthy eating encouraged, diet low in saturated fats and more whole grains      Relevant Medications   atorvastatin (LIPITOR) 10 MG tablet   Medication monitoring  encounter    Will check sgpt at f/u in May on the new statin      Urine test positive for microalbuminuria    Now on an ACE-I; recheck urine microalbumin:Cr at next visit; better control of diabetes should help as well      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  An after-visit summary was printed and given to the patient at [facility].  Please see the patient instructions which may contain other information and recommendations beyond what is mentioned above in the assessment and plan.

## 2015-07-08 NOTE — Patient Instructions (Addendum)
Keep up the great job with checking your feet, working on weight loss, healthier diet, etc Increase the sertraline from 100 mg daily to 150 mg daily Return on or after May 17th for next A1c and cholesterol check and visit Start the atorvastatin at 10 mg every night

## 2015-07-08 NOTE — Assessment & Plan Note (Signed)
Now on an ACE-I; recheck urine microalbumin:Cr at next visit; better control of diabetes should help as well

## 2015-07-14 ENCOUNTER — Other Ambulatory Visit: Payer: Self-pay | Admitting: Family Medicine

## 2015-07-14 NOTE — Telephone Encounter (Signed)
She has tapered up; new rx written

## 2015-07-21 ENCOUNTER — Encounter: Payer: Self-pay | Admitting: Family Medicine

## 2015-07-21 DIAGNOSIS — E11319 Type 2 diabetes mellitus with unspecified diabetic retinopathy without macular edema: Secondary | ICD-10-CM

## 2015-07-22 ENCOUNTER — Other Ambulatory Visit: Payer: Self-pay | Admitting: Family Medicine

## 2015-07-22 DIAGNOSIS — Z5181 Encounter for therapeutic drug level monitoring: Secondary | ICD-10-CM

## 2015-07-22 MED ORDER — INSULIN DEGLUDEC 100 UNIT/ML ~~LOC~~ SOPN: 10.0000 [IU] | PEN_INJECTOR | Freq: Every day | SUBCUTANEOUS | Status: DC

## 2015-07-22 NOTE — Telephone Encounter (Signed)
Please let patient know that we had hoped to have her labs rechecked around April 3rd to monitor this medicine Please ask her to stop by the HOSPITAL to have her potassium and kidney function checked (BMP) as soon as possible Very important to follow these on her medicine; thanks I'll send just five pills in so she doesn't run out, but I'd really love for her to go this week to get his bloodwork done please As soon as I see normal numbers, we'll get her 30 plus refills

## 2015-07-22 NOTE — Telephone Encounter (Signed)
We really wanted this done April 3rd, so she's actually more than 3 weeks late We like to check the labs a few weeks after initiating the ACE-I (medicine for BP and to protect kidneys) May 18th will be too late I'm sorry to inconvenience her, but I have to make sure her kidneys are okay She can go by the hospital lab before or after work, if you'll just make sure they have a copy of the order there for her; thanks!!

## 2015-07-22 NOTE — Telephone Encounter (Signed)
Samples given to her boyfriend at pt's request

## 2015-07-22 NOTE — Telephone Encounter (Signed)
Pt.notified

## 2015-07-22 NOTE — Telephone Encounter (Signed)
Pt states has an appt on may 18 and can do then.  She states works from 8-5 and cannot do any sooner it would be to hard?

## 2015-07-25 ENCOUNTER — Other Ambulatory Visit
Admission: RE | Admit: 2015-07-25 | Discharge: 2015-07-25 | Disposition: A | Discharge status: Home / Self Care | Payer: Managed Care, Other (non HMO) | Source: Ambulatory Visit | Attending: Family Medicine | Admitting: Family Medicine

## 2015-07-25 DIAGNOSIS — Z5181 Encounter for therapeutic drug level monitoring: Secondary | ICD-10-CM | POA: Diagnosis not present

## 2015-07-25 LAB — BASIC METABOLIC PANEL
ANION GAP: 10 (ref 5–15)
BUN: 15 mg/dL (ref 6–20)
CO2: 23 mmol/L (ref 22–32)
Calcium: 9.9 mg/dL (ref 8.9–10.3)
Chloride: 105 mmol/L (ref 101–111)
Creatinine, Ser: 0.68 mg/dL (ref 0.44–1.00)
GFR calc Af Amer: >60 mL/min (ref >60)
GFR calc non Af Amer: >60 mL/min (ref >60)
GLUCOSE: 178 mg/dL — AB (ref 65–99)
POTASSIUM: 4.6 mmol/L (ref 3.5–5.1)
Sodium: 138 mmol/L (ref 135–145)

## 2015-07-26 ENCOUNTER — Other Ambulatory Visit: Payer: Self-pay | Admitting: Family Medicine

## 2015-07-27 ENCOUNTER — Encounter: Payer: Self-pay | Admitting: Family Medicine

## 2015-07-27 NOTE — Telephone Encounter (Signed)
Breanna Rogers, please verify prescriptions with pharmacy and resolve if needed I prescribed sertraline in April I prescribed metformin in April Patient says they don't have them; please call her when you're done to update her Thank you

## 2015-07-27 NOTE — Telephone Encounter (Signed)
Roselyn Reef, please see my note (below)

## 2015-07-28 ENCOUNTER — Other Ambulatory Visit: Payer: Self-pay | Admitting: Family Medicine

## 2015-07-28 NOTE — Telephone Encounter (Signed)
Cr and K+ reviewed, approved

## 2015-08-04 ENCOUNTER — Other Ambulatory Visit: Payer: Self-pay | Admitting: Family Medicine

## 2015-08-09 ENCOUNTER — Other Ambulatory Visit: Payer: Self-pay | Admitting: Family Medicine

## 2015-08-09 NOTE — Telephone Encounter (Signed)
New metformin instructions sent 4/17 for 3 month supply

## 2015-08-13 ENCOUNTER — Ambulatory Visit: Payer: Managed Care, Other (non HMO) | Admitting: Family Medicine

## 2015-08-27 ENCOUNTER — Other Ambulatory Visit: Payer: Self-pay | Admitting: Family Medicine

## 2015-08-31 ENCOUNTER — Other Ambulatory Visit: Payer: Self-pay | Admitting: Family Medicine

## 2015-08-31 NOTE — Telephone Encounter (Signed)
Appointment made for 09-21-15

## 2015-08-31 NOTE — Telephone Encounter (Signed)
Please ask pt to schedule an appt; fasting labs; thank you

## 2015-09-21 ENCOUNTER — Encounter: Payer: Self-pay | Admitting: Family Medicine

## 2015-09-21 ENCOUNTER — Ambulatory Visit (INDEPENDENT_AMBULATORY_CARE_PROVIDER_SITE_OTHER): Payer: Managed Care, Other (non HMO) | Admitting: Family Medicine

## 2015-09-21 VITALS — BP 118/78 | HR 81 | Temp 98.3°F | Resp 14 | Wt 228.0 lb

## 2015-09-21 DIAGNOSIS — E11319 Type 2 diabetes mellitus with unspecified diabetic retinopathy without macular edema: Secondary | ICD-10-CM | POA: Diagnosis not present

## 2015-09-21 DIAGNOSIS — E113299 Type 2 diabetes mellitus with mild nonproliferative diabetic retinopathy without macular edema, unspecified eye: Secondary | ICD-10-CM

## 2015-09-21 DIAGNOSIS — Z5181 Encounter for therapeutic drug level monitoring: Secondary | ICD-10-CM

## 2015-09-21 DIAGNOSIS — S46811A Strain of other muscles, fascia and tendons at shoulder and upper arm level, right arm, initial encounter: Secondary | ICD-10-CM

## 2015-09-21 DIAGNOSIS — E669 Obesity, unspecified: Secondary | ICD-10-CM | POA: Diagnosis not present

## 2015-09-21 DIAGNOSIS — E785 Hyperlipidemia, unspecified: Secondary | ICD-10-CM | POA: Diagnosis not present

## 2015-09-21 DIAGNOSIS — F320 Major depressive disorder, single episode, mild: Secondary | ICD-10-CM

## 2015-09-21 LAB — COMPREHENSIVE METABOLIC PANEL WITH GFR
ALT: 25 U/L (ref 6–29)
AST: 14 U/L (ref 10–35)
Albumin: 4.3 g/dL (ref 3.6–5.1)
Alkaline Phosphatase: 68 U/L (ref 33–115)
BUN: 11 mg/dL (ref 7–25)
CO2: 26 mmol/L (ref 20–31)
Calcium: 9.7 mg/dL (ref 8.6–10.2)
Chloride: 101 mmol/L (ref 98–110)
Creat: 0.53 mg/dL (ref 0.50–1.10)
Glucose, Bld: 180 mg/dL — ABNORMAL HIGH (ref 65–99)
Potassium: 4.8 mmol/L (ref 3.5–5.3)
Sodium: 138 mmol/L (ref 135–146)
Total Bilirubin: 0.4 mg/dL (ref 0.2–1.2)
Total Protein: 6.9 g/dL (ref 6.1–8.1)

## 2015-09-21 LAB — LIPID PANEL
CHOLESTEROL: 148 mg/dL (ref 125–200)
HDL: 53 mg/dL (ref >=46)
LDL Cholesterol: 53 mg/dL (ref <130)
Total CHOL/HDL Ratio: 2.8 Ratio (ref <=5.0)
Triglycerides: 208 mg/dL — ABNORMAL HIGH (ref <150)
VLDL: 42 mg/dL — ABNORMAL HIGH (ref <30)

## 2015-09-21 LAB — HEMOGLOBIN A1C
HEMOGLOBIN A1C: 7.8 % — AB (ref <5.7)
MEAN PLASMA GLUCOSE: 177 mg/dL

## 2015-09-21 MED ORDER — METFORMIN HCL ER 500 MG PO TB24: 2000.0000 mg | ORAL_TABLET | Freq: Every evening | ORAL | Status: DC

## 2015-09-21 MED ORDER — CYCLOBENZAPRINE HCL 5 MG PO TABS: 5.0000 mg | ORAL_TABLET | Freq: Three times a day (TID) | ORAL | Status: DC | PRN

## 2015-09-21 NOTE — Assessment & Plan Note (Signed)
Continue meds, stable

## 2015-09-21 NOTE — Assessment & Plan Note (Signed)
Control sugars; see eye doctor in October

## 2015-09-21 NOTE — Progress Notes (Signed)
BP 118/78 mmHg  Pulse 81  Temp(Src) 98.3 F (36.8 C) (Oral)  Resp 14  Wt 228 lb (103.42 kg)  SpO2 97%   Subjective:    Patient ID: Breanna Rogers, female    DOB: June 15, 1965, 50 y.o.   MRN: [id number]  HPI: KERSTON Rogers is a 50 y.o. female  Chief Complaint  Patient presents with  . Medication Refill   Type 2 diabetes; sugars running 140s to 160s; some dry mouth; she goes to see eye doctor in October; bubbles in the eyes right now, has DR; no sugary drinks, diet or water; does eat wheat bread; no problems with feet; not taking Tresiba  High cholesterol; no milk; rare eggs; no much fatty meat; she had not tolerated statin previously, then took a break off, and is now back on the statin; seems to be tolerating it well  Mood; stable on the medicine, "I'm good"  Right trapezius is hard as a rock all the time; tried ibuprofen and tylenol and biofreeze; would love a muscle relaxer when I offered; had C5-C7 fused; used valium previously from her surgeon  Depression screen [facility] 2/9 09/21/2015 07/08/2015 05/07/2015  Decreased Interest 0 0 0  Down, Depressed, Hopeless 0 0 0  PHQ - 2 Score 0 0 0   Relevant past medical, surgical, family and social history reviewed Past Medical History  Diagnosis Date  . Diabetes mellitus type 2 with retinopathy ([place])   . 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[place]) 4/15, 10/15    seen every 6 months  . Hyperlipidemia   . Obesity   . Vitamin D deficiency   . Elevated transaminase level   . Hot flashes   . Spasm of muscle   . Numbness of foot    Past Surgical History  Procedure Laterality Date  . Tonsillectomy and adenoidectomy  1976  . Tubal ligation  1990  . Cervical fusion  2013    C5-7 at [facility], (ACDF C5-6 with removal of hardware)   Family History  Problem Relation Age of Onset  . Cancer Mother     cervical  . Asthma Mother   . Diabetes Mother   . Heart disease Mother   . Hyperlipidemia Mother   . Heart disease Father   .  Hyperlipidemia Father   . Hypertension Father   . Diabetes Sister   . Thyroid disease Sister   . Cancer Sister   . Diabetes Brother   . Hypertension Brother   . Stroke Neg Hx   . COPD Sister    Social History  Substance Use Topics  . Smoking status: Former Smoker -- 1.00 packs/day for 35 years    Types: Cigarettes    Quit date: 03/28/2010  . Smokeless tobacco: Never Used  . Alcohol Use: No   Interim medical history since last visit reviewed. Allergies and medications reviewed  Review of Systems Per HPI unless specifically indicated above     Objective:    BP 118/78 mmHg  Pulse 81  Temp(Src) 98.3 F (36.8 C) (Oral)  Resp 14  Wt 228 lb (103.42 kg)  SpO2 97%  Wt Readings from Last 3 Encounters:  09/21/15 228 lb (103.42 kg)  07/08/15 227 lb (102.967 kg)  06/12/15 228 lb (103.42 kg)   body mass index is 33.65 kg/(m^2).  Physical Exam  Constitutional: She appears well-developed and well-nourished. No distress.  obese  HENT:  Head: Normocephalic and atraumatic.  Eyes: EOM are normal. No scleral icterus.  Neck: No thyromegaly  present.  Cardiovascular: Normal rate, regular rhythm and normal heart sounds.   No murmur heard. Pulmonary/Chest: Effort normal and breath sounds normal. No respiratory distress. She has no wheezes.  Abdominal: Soft. She exhibits no distension.  Musculoskeletal: Normal range of motion. She exhibits no edema.  Neurological: She is alert.  Skin: Skin is warm and dry. She is not diaphoretic. No pallor.  Psychiatric: She has a normal mood and affect. Her behavior is normal. Judgment and thought content normal.   Diabetic Foot Form - Detailed   Diabetic Foot Exam - detailed  Diabetic Foot exam was performed with the following findings:  Yes 09/21/2015  8:20 AM  Visual Foot Exam completed.:  Yes  Are the toenails long?:  No  Are the toenails thick?:  No  Are the toenails ingrown?:  No  Normal Range of Motion:  Yes    Pulse Foot Exam completed.:   Yes  Right Dorsalis Pedis:  Present Left Dorsalis Pedis:  Present  Sensory Foot Exam Completed.:  Yes  Swelling:  No  Semmes-Weinstein Monofilament Test  R Site 1-Great Toe:  Pos L Site 1-Great Toe:  Pos  R Site 4:  Pos L Site 4:  Pos  R Site 5:  Pos L Site 5:  Pos       Results for orders placed or performed during the hospital encounter of [id number]  Basic metabolic panel  Result Value Ref Range   Sodium 138 135 - 145 mmol/L   Potassium 4.6 3.5 - 5.1 mmol/L   Chloride 105 101 - 111 mmol/L   CO2 23 22 - 32 mmol/L   Glucose, Bld 178 (H) 65 - 99 mg/dL   BUN 15 6 - 20 mg/dL   Creatinine, Ser 0.68 0.44 - 1.00 mg/dL   Calcium 9.9 8.9 - 10.3 mg/dL   GFR calc non Af Amer >60 >60 mL/min   GFR calc Af Amer >60 >60 mL/min   Anion gap 10 5 - 15      Assessment & Plan:   Problem List Items Addressed This Visit      Endocrine   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HCC)    Control sugars; see eye doctor in October      Relevant Medications   metFORMIN (GLUCOPHAGE-XR) 500 MG 24 hr tablet   Diabetes mellitus type 2 with retinopathy (HCC) - Primary    Check A1c; foot exam by MD today      Relevant Medications   metFORMIN (GLUCOPHAGE-XR) 500 MG 24 hr tablet   Other Relevant Orders   Hemoglobin A1c     Other   Obesity    See AVS      Relevant Medications   metFORMIN (GLUCOPHAGE-XR) 500 MG 24 hr tablet   Mild major depression (HCC)    Continue meds, stable      Medication monitoring encounter    Check SGPT and creatinine, K+      Relevant Orders   Comprehensive metabolic panel   Dyslipidemia    Tolerating statin well; check lipids today; avoid sat fats      Relevant Orders   Lipid panel    Other Visit Diagnoses    Trapezius strain, right, initial encounter        Relevant Medications    cyclobenzaprine (FLEXERIL) 5 MG tablet       Follow up plan: Return in about 6 months (around 03/29/2016) for fasting labs and visit.  An after-visit summary was  printed and given to the patient  at [place].  Please see the patient instructions which may contain other information and recommendations beyond what is mentioned above in the assessment and plan.  Meds ordered this encounter  Medications  . metFORMIN (GLUCOPHAGE-XR) 500 MG 24 hr tablet    Sig: Take 4 tablets (2,000 mg total) by mouth every evening.    Dispense:  120 tablet    Refill:  6  . cyclobenzaprine (FLEXERIL) 5 MG tablet    Sig: Take 1 tablet (5 mg total) by mouth 3 (three) times daily as needed for muscle spasms.    Dispense:  50 tablet    Refill:  1    Orders Placed This Encounter  Procedures  . Lipid panel  . Comprehensive metabolic panel  . Hemoglobin A1c

## 2015-09-21 NOTE — Assessment & Plan Note (Signed)
Check A1c; foot exam by MD today 

## 2015-09-21 NOTE — Patient Instructions (Signed)
Return in January Please do see your eye doctor regularly, and have your eyes examined every year (or more often per his or her recommendation) Check your feet every night and let me know right away of any sores, infections, numbness, etc. Try to limit sweets, white bread, white rice, white potatoes It is okay with me for you to not check your fingerstick blood sugars (per [employer] of [employer]ocrin[employer] Best Practices), unless you are interested and feel it would be helpful for you Check out the information at familydoctor.org entitled "Nutrition for Weight Loss: What You Need to Know about Fad Diets" Try to lose between 1-2 pounds per week by taking in fewer calories and burning off more calories You can succeed by limiting portions, limiting foods dense in calories and fat, becoming more active, and drinking 8 glasses of water a day (64 ounces) Don't skip meals, especially breakfast, as skipping meals may alter your metabolism Do not use over-the-counter weight loss pills or gimmicks that claim rapid weight loss A healthy BMI (or body mass index) is between 18.5 and 24.9 You can calculate your ideal BMI at the [place] website [employer]fr Try to limit saturated fats in your diet (bologna, hot dogs, barbeque, cheeseburgers, hamburgers, steak, bacon, sausage, cheese, etc.) and get more fresh fruits, vegetables, and whole grains

## 2015-09-21 NOTE — Assessment & Plan Note (Signed)
Tolerating statin well; check lipids today; avoid sat fats

## 2015-09-21 NOTE — Assessment & Plan Note (Signed)
See AVS

## 2015-09-21 NOTE — Assessment & Plan Note (Signed)
Check SGPT and creatinine, K+

## 2015-10-04 ENCOUNTER — Other Ambulatory Visit: Payer: Self-pay | Admitting: Family Medicine

## 2015-10-05 NOTE — Telephone Encounter (Signed)
SGPT and lipids from June 2017 reviewed; Rx approved

## 2015-11-06 ENCOUNTER — Encounter: Payer: Self-pay | Admitting: Family Medicine

## 2015-11-06 MED ORDER — LORAZEPAM 0.5 MG PO TABS: 0.2500 mg | ORAL_TABLET | Freq: Four times a day (QID) | ORAL | 0 refills | Status: DC | PRN

## 2016-01-12 LAB — HM DIABETES EYE EXAM

## 2016-01-25 ENCOUNTER — Other Ambulatory Visit: Payer: Self-pay | Admitting: Family Medicine

## 2016-01-25 MED ORDER — SITAGLIPTIN PHOSPHATE 100 MG PO TABS: 100.0000 mg | ORAL_TABLET | Freq: Every day | ORAL | 2 refills | Status: DC

## 2016-01-29 ENCOUNTER — Other Ambulatory Visit: Payer: Self-pay | Admitting: Family Medicine

## 2016-02-27 ENCOUNTER — Other Ambulatory Visit: Payer: Self-pay | Admitting: Family Medicine

## 2016-02-27 NOTE — Telephone Encounter (Signed)
Last Cr and K+ reviewed; Rx approved 

## 2016-03-29 ENCOUNTER — Other Ambulatory Visit: Payer: Self-pay | Admitting: Family Medicine

## 2016-03-29 NOTE — Telephone Encounter (Signed)
appt on 04/05/15 Glitch in computer system; I was not getting refill requests; addressing today

## 2016-03-31 ENCOUNTER — Other Ambulatory Visit: Payer: Self-pay | Admitting: Family Medicine

## 2016-04-01 NOTE — Telephone Encounter (Signed)
I just approved 30 pills on 03/29/16 Please resolve with pharmacy

## 2016-04-01 NOTE — Telephone Encounter (Signed)
Pharmacy notified.

## 2016-04-04 ENCOUNTER — Encounter: Payer: Self-pay | Admitting: Family Medicine

## 2016-04-04 ENCOUNTER — Ambulatory Visit (INDEPENDENT_AMBULATORY_CARE_PROVIDER_SITE_OTHER): Payer: Managed Care, Other (non HMO) | Admitting: Family Medicine

## 2016-04-04 VITALS — BP 118/78 | HR 90 | Temp 98.3°F | Resp 14 | Wt 222.2 lb

## 2016-04-04 DIAGNOSIS — E785 Hyperlipidemia, unspecified: Secondary | ICD-10-CM | POA: Diagnosis not present

## 2016-04-04 DIAGNOSIS — F320 Major depressive disorder, single episode, mild: Secondary | ICD-10-CM

## 2016-04-04 DIAGNOSIS — R809 Proteinuria, unspecified: Secondary | ICD-10-CM | POA: Diagnosis not present

## 2016-04-04 DIAGNOSIS — Z23 Encounter for immunization: Secondary | ICD-10-CM | POA: Diagnosis not present

## 2016-04-04 DIAGNOSIS — Z5181 Encounter for therapeutic drug level monitoring: Secondary | ICD-10-CM | POA: Diagnosis not present

## 2016-04-04 DIAGNOSIS — E11319 Type 2 diabetes mellitus with unspecified diabetic retinopathy without macular edema: Secondary | ICD-10-CM

## 2016-04-04 LAB — HEMOGLOBIN A1C
HEMOGLOBIN A1C: 8.2 % — AB (ref <5.7)
MEAN PLASMA GLUCOSE: 189 mg/dL

## 2016-04-04 LAB — COMPLETE METABOLIC PANEL WITH GFR
ALT: 30 U/L — AB (ref 6–29)
AST: 22 U/L (ref 10–35)
Albumin: 4.8 g/dL (ref 3.6–5.1)
Alkaline Phosphatase: 82 U/L (ref 33–130)
BUN: 13 mg/dL (ref 7–25)
CHLORIDE: 98 mmol/L (ref 98–110)
CO2: 26 mmol/L (ref 20–31)
CREATININE: 0.74 mg/dL (ref 0.50–1.05)
Calcium: 10.4 mg/dL (ref 8.6–10.4)
GFR, Est Non African American: >89 mL/min (ref >=60)
Glucose, Bld: 190 mg/dL — ABNORMAL HIGH (ref 65–99)
POTASSIUM: 4.9 mmol/L (ref 3.5–5.3)
Sodium: 136 mmol/L (ref 135–146)
Total Bilirubin: 0.5 mg/dL (ref 0.2–1.2)
Total Protein: 8.6 g/dL — ABNORMAL HIGH (ref 6.1–8.1)

## 2016-04-04 LAB — LIPID PANEL
CHOLESTEROL: 186 mg/dL (ref <200)
HDL: 64 mg/dL (ref >50)
LDL Cholesterol: 75 mg/dL (ref <100)
Total CHOL/HDL Ratio: 2.9 Ratio (ref <5.0)
Triglycerides: 234 mg/dL — ABNORMAL HIGH (ref <150)
VLDL: 47 mg/dL — ABNORMAL HIGH (ref <30)

## 2016-04-04 MED ORDER — SERTRALINE HCL 100 MG PO TABS: 200.0000 mg | ORAL_TABLET | Freq: Every day | ORAL | 5 refills | Status: DC

## 2016-04-04 NOTE — Progress Notes (Signed)
BP 118/78   Pulse 90   Temp 98.3 F (36.8 C)   Resp 14   Wt 222 lb 3 oz (100.8 kg)   SpO2 96%   BMI 32.81 kg/m    Subjective:    Patient ID: [name], female    DOB: November 28, 1965, 51 y.o.   MRN: [id number]  HPI: LATOY MALFITANO is a 51 y.o. female  Chief Complaint  Patient presents with  . Diabetes  . Hyperlipidemia   Here for follow-up Type 2 diabetes; probably not doing well; checks every once in a while; knows to stay away from whites; has the education she needs; last eye exam within the last year; she had some nonproliferative DR; brought her A1c down phenomenally from over 14 to 7.8 last check; check today Lab Results  Component Value Date   HGBA1C 7.8 (H) 09/21/2015    High cholesterol; she had great improvement with her TG and LDL last check; hardly any eggs; rare fatty meats Lab Results  Component Value Date   CHOL 148 09/21/2015   CHOL 239 (H) 05/07/2015   Lab Results  Component Value Date   HDL 53 09/21/2015   HDL 42 05/07/2015   Lab Results  Component Value Date   LDLCALC 53 09/21/2015   LDLCALC 144 (H) 05/07/2015   Lab Results  Component Value Date   TRIG 208 (H) 09/21/2015   TRIG 266 (H) 05/07/2015   Lab Results  Component Value Date   CHOLHDL 2.8 09/21/2015   No results found for: LDLDIRECT  Depression; lost two siblings this past year; currently on 150 mg of zoloft and wonders if this can be increased; tearful at times; has a sister locally; just got married (September 23rd) and that is a light in what was otherwise a dark year for her  Depression screen [facility] 2/9 04/04/2016 09/21/2015 07/08/2015 05/07/2015  Decreased Interest 0 0 0 0  Down, Depressed, Hopeless 0 0 0 0  PHQ - 2 Score 0 0 0 0   Relevant past medical, surgical, family and social history reviewed Past Medical History:  Diagnosis Date  . Diabetes mellitus type 2 with retinopathy ([place])   . Elevated transaminase level   . Hot flashes   . Hyperlipidemia   . Mild  nonproliferative diabetic retinopathy ([place]) 4/15, 10/15   seen every 6 months  . Numbness of foot   . Obesity   . Spasm of muscle   . Vitamin D deficiency    Past Surgical History:  Procedure Laterality Date  . CERVICAL FUSION  2013   C5-7 at [facility], (ACDF C5-6 with removal of hardware)  . TONSILLECTOMY AND ADENOIDECTOMY  1976  . TUBAL LIGATION  1990   Family History  Problem Relation Age of Onset  . Cancer Mother     cervical  . Asthma Mother   . Diabetes Mother   . Heart disease Mother   . Hyperlipidemia Mother   . Heart disease Father   . Hyperlipidemia Father   . Hypertension Father   . Diabetes Sister   . Thyroid disease Sister   . Cancer Sister   . Diabetes Brother   . Hypertension Brother   . Stroke Neg Hx   . COPD Sister    Social History  Substance Use Topics  . Smoking status: Former Smoker    Packs/day: 1.00    Years: 35.00    Types: Cigarettes    Quit date: 03/28/2010  . Smokeless tobacco: Never Used  .  Alcohol use No   Interim medical history since last visit reviewed. Allergies and medications reviewed  Review of Systems Per HPI unless specifically indicated above     Objective:    BP 118/78   Pulse 90   Temp 98.3 F (36.8 C)   Resp 14   Wt 222 lb 3 oz (100.8 kg)   SpO2 96%   BMI 32.81 kg/m   Wt Readings from Last 3 Encounters:  04/04/16 222 lb 3 oz (100.8 kg)  09/21/15 228 lb (103.4 kg)  07/08/15 227 lb (103 kg)    Physical Exam  Constitutional: She appears well-developed and well-nourished. No distress.  obese  HENT:  Head: Normocephalic and atraumatic.  Eyes: EOM are normal. No scleral icterus.  Neck: No thyromegaly present.  Cardiovascular: Normal rate, regular rhythm and normal heart sounds.   No murmur heard. Pulmonary/Chest: Effort normal and breath sounds normal. No respiratory distress. She has no wheezes.  Abdominal: Soft. She exhibits no distension.  Musculoskeletal: Normal range of motion. She exhibits no edema.    Neurological: She is alert.  Skin: Skin is warm and dry. She is not diaphoretic. No pallor.  Psychiatric: She has a normal mood and affect. Her behavior is normal. Judgment and thought content normal.   Diabetic Foot Form - Detailed   Diabetic Foot Exam - detailed Diabetic Foot exam was performed with the following findings:  Yes 04/04/2016  8:31 AM  Visual Foot Exam completed.:  Yes  Is there a history of foot ulcer?:  No Are the toenails ingrown?:  No Normal Range of Motion:  Yes Pulse Foot Exam completed.:  Yes  Right Dorsalis Pedis:  Present Left Dorsalis Pedis:  Present  Sensory Foot Exam Completed.:  Yes Swelling:  No Semmes-Weinstein Monofilament Test R Site 1-Great Toe:  Pos L Site 1-Great Toe:  Pos  R Site 4:  Pos L Site 4:  Pos  R Site 5:  Pos L Site 5:  Pos        Results for orders placed or performed in visit on 01/14/16  HM DIABETES EYE EXAM  Result Value Ref Range   HM Diabetic Eye Exam Retinopathy (A) No Retinopathy      Assessment & Plan:   Problem List Items Addressed This Visit      Endocrine   Diabetes mellitus type 2 with retinopathy ([place]) - Primary    Foot exam by MD; limit whites; check A1c      Relevant Orders   Hemoglobin A1c   Microalbumin / creatinine urine ratio     Other   Urine test positive for microalbuminuria    Check urine today      Relevant Orders   Microalbumin / creatinine urine ratio   Mild major depression (HCC)    Increase sertraline; call me if any hypomania or mania      Relevant Medications   sertraline (ZOLOFT) 100 MG tablet   Medication monitoring encounter    Monitor SGPT      Relevant Orders   COMPLETE METABOLIC PANEL WITH GFR   Dyslipidemia    Check lipids today, fasting; avoid fatty meats and saturated fats      Relevant Orders   Lipid panel    Other Visit Diagnoses    Needs flu shot       Relevant Orders   Flu Vaccine QUAD 36+ mos PF IM (Fluarix & Fluzone Quad PF) (Completed)      Follow up  plan: Return in about  3 months (around 07/03/2016) for diabetes (if A1c is 7 or higher), or 6 month f/u if less than 7.  An after-visit summary was printed and given to the patient at [place].  Please see the patient instructions which may contain other information and recommendations beyond what is mentioned above in the assessment and plan.  Meds ordered this encounter  Medications  . sertraline (ZOLOFT) 100 MG tablet    Sig: Take 2 tablets (200 mg total) by mouth daily.    Dispense:  60 tablet    Refill:  5    Changing sig    Orders Placed This Encounter  Procedures  . Flu Vaccine QUAD 36+ mos PF IM (Fluarix & Fluzone Quad PF)  . Hemoglobin A1c  . Lipid panel  . Microalbumin / creatinine urine ratio  . COMPLETE METABOLIC PANEL WITH GFR

## 2016-04-04 NOTE — Assessment & Plan Note (Signed)
Increase sertraline; call me if any hypomania or mania

## 2016-04-04 NOTE — Assessment & Plan Note (Signed)
Foot exam by MD; limit whites; check A1c

## 2016-04-04 NOTE — Assessment & Plan Note (Signed)
Check lipids today, fasting; avoid fatty meats and saturated fats

## 2016-04-04 NOTE — Patient Instructions (Signed)
Increase the sertraline to 200 mg daily Please call me if any problems Please do see your eye doctor regularly, and have your eyes examined every year (or more often per his or her recommendation) Check your feet every night and let me know right away of any sores, infections, numbness, etc. Try to limit sweets, white bread, white rice, white potatoes It is okay with me for you to not check your fingerstick blood sugars (per [employer] of Endocrinology Best Practices), unless you are interested and feel it would be helpful for you We'll get labs today If you have not heard anything from my staff in a week about any orders/referrals/studies from today, please contact us here to follow-up [phone]

## 2016-04-04 NOTE — Assessment & Plan Note (Signed)
Monitor SGPT 

## 2016-04-04 NOTE — Assessment & Plan Note (Signed)
Check urine today 

## 2016-04-05 ENCOUNTER — Other Ambulatory Visit: Payer: Self-pay | Admitting: Family Medicine

## 2016-04-05 DIAGNOSIS — E8809 Other disorders of plasma-protein metabolism, not elsewhere classified: Secondary | ICD-10-CM

## 2016-04-05 LAB — MICROALBUMIN / CREATININE URINE RATIO
Creatinine, Urine: 101 mg/dL (ref 20–320)
MICROALB UR: 0.6 mg/dL
MICROALB/CREAT RATIO: 6 ug/mg{creat} (ref <30)

## 2016-04-05 MED ORDER — EMPAGLIFLOZIN 10 MG PO TABS: 10.0000 mg | ORAL_TABLET | Freq: Every day | ORAL | 5 refills | Status: DC

## 2016-04-05 NOTE — Progress Notes (Signed)
Add jardiance to regimen Recheck protein in one month Note to patient

## 2016-04-05 NOTE — Assessment & Plan Note (Signed)
Recheck protein in one month

## 2016-04-17 ENCOUNTER — Other Ambulatory Visit: Payer: Self-pay | Admitting: Family Medicine

## 2016-05-02 ENCOUNTER — Encounter: Payer: Self-pay | Admitting: Family Medicine

## 2016-05-05 ENCOUNTER — Other Ambulatory Visit: Payer: Self-pay | Admitting: Family Medicine

## 2016-05-06 NOTE — Telephone Encounter (Signed)
Last Cr reviewed; Rx approved 

## 2016-06-01 ENCOUNTER — Ambulatory Visit: Payer: Managed Care, Other (non HMO) | Admitting: Family Medicine

## 2016-06-01 ENCOUNTER — Telehealth: Payer: Self-pay | Admitting: Family Medicine

## 2016-06-01 NOTE — Telephone Encounter (Signed)
Patient called and notified to Go to ER or urgent care, due to classic signs of heart attack.  Patient thinks she is not having one and did not wants to go to ER due to cost.  I notified Dr. Sanda Klein and she told me to tell her to a least go to urgent care because she would need EKG, Labs and chest xray done right away.  I notified patient but she seemed to not be to concerned so not sure if she will actually go.  But she was informed of the medical advice.

## 2016-06-01 NOTE — Telephone Encounter (Signed)
I see why patient is scheduled today She needs to go NOW to urgent care or the ER where they can do an EKG and CXR and labs NOW

## 2016-06-06 ENCOUNTER — Encounter: Payer: Self-pay | Admitting: Family Medicine

## 2016-06-06 ENCOUNTER — Encounter: Payer: Self-pay | Admitting: Emergency Medicine

## 2016-06-06 ENCOUNTER — Inpatient Hospital Stay
Admission: EM | Admit: 2016-06-06 | Discharge: 2016-06-08 | DRG: 440 | Disposition: A | Discharge status: Home / Self Care | Payer: Managed Care, Other (non HMO) | Attending: Internal Medicine | Admitting: Internal Medicine

## 2016-06-06 ENCOUNTER — Inpatient Hospital Stay: Payer: Managed Care, Other (non HMO)

## 2016-06-06 ENCOUNTER — Ambulatory Visit (INDEPENDENT_AMBULATORY_CARE_PROVIDER_SITE_OTHER): Payer: Managed Care, Other (non HMO) | Admitting: Family Medicine

## 2016-06-06 VITALS — BP 118/72 | HR 98 | Temp 98.4°F | Resp 16 | Wt 209.1 lb

## 2016-06-06 DIAGNOSIS — Z79899 Other long term (current) drug therapy: Secondary | ICD-10-CM | POA: Diagnosis not present

## 2016-06-06 DIAGNOSIS — E669 Obesity, unspecified: Secondary | ICD-10-CM | POA: Diagnosis present

## 2016-06-06 DIAGNOSIS — Z1211 Encounter for screening for malignant neoplasm of colon: Secondary | ICD-10-CM

## 2016-06-06 DIAGNOSIS — E785 Hyperlipidemia, unspecified: Secondary | ICD-10-CM | POA: Diagnosis present

## 2016-06-06 DIAGNOSIS — Z7984 Long term (current) use of oral hypoglycemic drugs: Secondary | ICD-10-CM

## 2016-06-06 DIAGNOSIS — Z981 Arthrodesis status: Secondary | ICD-10-CM | POA: Diagnosis not present

## 2016-06-06 DIAGNOSIS — Z833 Family history of diabetes mellitus: Secondary | ICD-10-CM

## 2016-06-06 DIAGNOSIS — Z8349 Family history of other endocrine, nutritional and metabolic diseases: Secondary | ICD-10-CM

## 2016-06-06 DIAGNOSIS — E113299 Type 2 diabetes mellitus with mild nonproliferative diabetic retinopathy without macular edema, unspecified eye: Secondary | ICD-10-CM | POA: Diagnosis present

## 2016-06-06 DIAGNOSIS — Z8249 Family history of ischemic heart disease and other diseases of the circulatory system: Secondary | ICD-10-CM | POA: Diagnosis not present

## 2016-06-06 DIAGNOSIS — E11319 Type 2 diabetes mellitus with unspecified diabetic retinopathy without macular edema: Secondary | ICD-10-CM

## 2016-06-06 DIAGNOSIS — Z87891 Personal history of nicotine dependence: Secondary | ICD-10-CM

## 2016-06-06 DIAGNOSIS — R1011 Right upper quadrant pain: Secondary | ICD-10-CM | POA: Diagnosis not present

## 2016-06-06 DIAGNOSIS — Z882 Allergy status to sulfonamides status: Secondary | ICD-10-CM

## 2016-06-06 DIAGNOSIS — F329 Major depressive disorder, single episode, unspecified: Secondary | ICD-10-CM | POA: Diagnosis present

## 2016-06-06 DIAGNOSIS — R1013 Epigastric pain: Secondary | ICD-10-CM | POA: Diagnosis not present

## 2016-06-06 DIAGNOSIS — R059 Cough, unspecified: Secondary | ICD-10-CM

## 2016-06-06 DIAGNOSIS — Z1231 Encounter for screening mammogram for malignant neoplasm of breast: Secondary | ICD-10-CM

## 2016-06-06 DIAGNOSIS — Z825 Family history of asthma and other chronic lower respiratory diseases: Secondary | ICD-10-CM | POA: Diagnosis not present

## 2016-06-06 DIAGNOSIS — Z1239 Encounter for other screening for malignant neoplasm of breast: Secondary | ICD-10-CM

## 2016-06-06 DIAGNOSIS — K859 Acute pancreatitis without necrosis or infection, unspecified: Secondary | ICD-10-CM

## 2016-06-06 DIAGNOSIS — R05 Cough: Secondary | ICD-10-CM

## 2016-06-06 DIAGNOSIS — Z7982 Long term (current) use of aspirin: Secondary | ICD-10-CM | POA: Diagnosis not present

## 2016-06-06 DIAGNOSIS — Z6830 Body mass index (BMI) 30.0-30.9, adult: Secondary | ICD-10-CM | POA: Diagnosis not present

## 2016-06-06 DIAGNOSIS — Z808 Family history of malignant neoplasm of other organs or systems: Secondary | ICD-10-CM

## 2016-06-06 DIAGNOSIS — Z823 Family history of stroke: Secondary | ICD-10-CM

## 2016-06-06 DIAGNOSIS — Z8719 Personal history of other diseases of the digestive system: Secondary | ICD-10-CM | POA: Diagnosis present

## 2016-06-06 LAB — LIPID PANEL
Cholesterol: 164 mg/dL (ref 0–200)
HDL: 59 mg/dL (ref >40)
LDL Cholesterol: 68 mg/dL (ref 0–99)
TRIGLYCERIDES: 187 mg/dL — AB (ref <150)
Total CHOL/HDL Ratio: 2.8 RATIO
VLDL: 37 mg/dL (ref 0–40)

## 2016-06-06 LAB — CBC
HCT: 41.6 % (ref 35.0–47.0)
HEMOGLOBIN: 14.1 g/dL (ref 12.0–16.0)
MCH: 30.2 pg (ref 26.0–34.0)
MCHC: 33.8 g/dL (ref 32.0–36.0)
MCV: 89.4 fL (ref 80.0–100.0)
PLATELETS: 264 10*3/uL (ref 150–440)
RBC: 4.65 MIL/uL (ref 3.80–5.20)
RDW: 14.2 % (ref 11.5–14.5)
WBC: 9.5 10*3/uL (ref 3.6–11.0)

## 2016-06-06 LAB — COMPREHENSIVE METABOLIC PANEL
ALT: 30 U/L (ref 14–54)
AST: 24 U/L (ref 15–41)
Albumin: 4.7 g/dL (ref 3.5–5.0)
Alkaline Phosphatase: 70 U/L (ref 38–126)
Anion gap: 11 (ref 5–15)
BUN: 15 mg/dL (ref 6–20)
CO2: 27 mmol/L (ref 22–32)
CREATININE: 0.75 mg/dL (ref 0.44–1.00)
Calcium: 9.9 mg/dL (ref 8.9–10.3)
Chloride: 98 mmol/L — ABNORMAL LOW (ref 101–111)
GFR calc non Af Amer: >60 mL/min (ref >60)
Glucose, Bld: 141 mg/dL — ABNORMAL HIGH (ref 65–99)
Potassium: 4.2 mmol/L (ref 3.5–5.1)
SODIUM: 136 mmol/L (ref 135–145)
Total Bilirubin: 0.7 mg/dL (ref 0.3–1.2)
Total Protein: 8.6 g/dL — ABNORMAL HIGH (ref 6.5–8.1)

## 2016-06-06 LAB — GLUCOSE, CAPILLARY
Glucose-Capillary: 112 mg/dL — ABNORMAL HIGH (ref 65–99)
Glucose-Capillary: 103 mg/dL — ABNORMAL HIGH (ref 65–99)

## 2016-06-06 LAB — LIPASE, BLOOD: LIPASE: 998 U/L — AB (ref 11–51)

## 2016-06-06 MED ORDER — ACETAMINOPHEN 650 MG RE SUPP: 650.0000 mg | Freq: Four times a day (QID) | RECTAL | Status: DC | PRN

## 2016-06-06 MED ORDER — BISACODYL 10 MG RE SUPP
10.0000 mg | Freq: Every day | RECTAL | Status: DC | PRN
  Filled 2016-06-06: qty 1

## 2016-06-06 MED ORDER — HYDROCODONE-ACETAMINOPHEN 5-325 MG PO TABS
1.0000 | ORAL_TABLET | ORAL | Status: DC | PRN
  Administered 2016-06-06 – 2016-06-07 (×4): 2 via ORAL
  Filled 2016-06-06 (×4): qty 2

## 2016-06-06 MED ORDER — SODIUM CHLORIDE 0.9 % IV BOLUS (SEPSIS)
500.0000 mL | Freq: Once | INTRAVENOUS | Status: AC
  Administered 2016-06-06: 500 mL via INTRAVENOUS

## 2016-06-06 MED ORDER — MORPHINE SULFATE (PF) 4 MG/ML IV SOLN
4.0000 mg | Freq: Once | INTRAVENOUS | Status: AC
  Administered 2016-06-06: 4 mg via INTRAVENOUS
  Filled 2016-06-06: qty 1

## 2016-06-06 MED ORDER — ONDANSETRON HCL 4 MG PO TABS: 4.0000 mg | ORAL_TABLET | Freq: Four times a day (QID) | ORAL | Status: DC | PRN

## 2016-06-06 MED ORDER — LISINOPRIL 5 MG PO TABS
5.0000 mg | ORAL_TABLET | Freq: Every day | ORAL | Status: DC
Start: 2016-06-06 — End: 2016-06-08
  Administered 2016-06-06 – 2016-06-08 (×3): 5 mg via ORAL
  Filled 2016-06-06 (×3): qty 1

## 2016-06-06 MED ORDER — ONDANSETRON HCL 4 MG/2ML IJ SOLN
4.0000 mg | Freq: Four times a day (QID) | INTRAMUSCULAR | Status: DC | PRN
  Administered 2016-06-06 – 2016-06-07 (×2): 4 mg via INTRAVENOUS
  Filled 2016-06-06 (×2): qty 2

## 2016-06-06 MED ORDER — SODIUM CHLORIDE 0.9 % IV SOLN
INTRAVENOUS | Status: AC
  Administered 2016-06-06: 1000 mL via INTRAVENOUS
  Administered 2016-06-07 (×2): via INTRAVENOUS

## 2016-06-06 MED ORDER — ACETAMINOPHEN 325 MG PO TABS
650.0000 mg | ORAL_TABLET | Freq: Four times a day (QID) | ORAL | Status: DC | PRN
  Administered 2016-06-07 – 2016-06-08 (×2): 650 mg via ORAL
  Filled 2016-06-06 (×2): qty 2

## 2016-06-06 MED ORDER — ONDANSETRON HCL 4 MG/2ML IJ SOLN
4.0000 mg | Freq: Once | INTRAMUSCULAR | Status: AC
  Administered 2016-06-06: 4 mg via INTRAVENOUS
  Filled 2016-06-06: qty 2

## 2016-06-06 MED ORDER — SERTRALINE HCL 50 MG PO TABS
200.0000 mg | ORAL_TABLET | Freq: Every day | ORAL | Status: DC
  Administered 2016-06-06 – 2016-06-08 (×3): 200 mg via ORAL
  Filled 2016-06-06 (×3): qty 4

## 2016-06-06 MED ORDER — ATORVASTATIN CALCIUM 20 MG PO TABS
40.0000 mg | ORAL_TABLET | Freq: Every day | ORAL | Status: DC
  Administered 2016-06-06 – 2016-06-07 (×2): 40 mg via ORAL
  Filled 2016-06-06 (×2): qty 2

## 2016-06-06 MED ORDER — PANTOPRAZOLE SODIUM 40 MG PO TBEC
40.0000 mg | DELAYED_RELEASE_TABLET | Freq: Every day | ORAL | Status: DC
  Administered 2016-06-07 – 2016-06-08 (×2): 40 mg via ORAL
  Filled 2016-06-06 (×2): qty 1

## 2016-06-06 MED ORDER — INSULIN ASPART 100 UNIT/ML ~~LOC~~ SOLN: 0.0000 [IU] | Freq: Three times a day (TID) | SUBCUTANEOUS | Status: DC

## 2016-06-06 MED ORDER — IOPAMIDOL (ISOVUE-300) INJECTION 61%: 30.0000 mL | Freq: Once | INTRAVENOUS | Status: DC | PRN

## 2016-06-06 MED ORDER — METFORMIN HCL ER 500 MG PO TB24
1000.0000 mg | ORAL_TABLET | Freq: Two times a day (BID) | ORAL | Status: DC
  Administered 2016-06-06 – 2016-06-07 (×2): 1000 mg via ORAL
  Filled 2016-06-06 (×2): qty 2

## 2016-06-06 MED ORDER — IOPAMIDOL (ISOVUE-300) INJECTION 61%
100.0000 mL | Freq: Once | INTRAVENOUS | Status: AC | PRN
  Administered 2016-06-06: 100 mL via INTRAVENOUS

## 2016-06-06 MED ORDER — ASPIRIN EC 81 MG PO TBEC
81.0000 mg | DELAYED_RELEASE_TABLET | Freq: Every day | ORAL | Status: DC
  Administered 2016-06-06 – 2016-06-08 (×3): 81 mg via ORAL
  Filled 2016-06-06 (×3): qty 1

## 2016-06-06 MED ORDER — ENOXAPARIN SODIUM 40 MG/0.4ML ~~LOC~~ SOLN
40.0000 mg | SUBCUTANEOUS | Status: DC
  Administered 2016-06-06 – 2016-06-07 (×2): 40 mg via SUBCUTANEOUS
  Filled 2016-06-06 (×2): qty 0.4

## 2016-06-06 MED ORDER — POLYETHYLENE GLYCOL 3350 17 G PO PACK: 17.0000 g | PACK | Freq: Every day | ORAL | Status: DC | PRN

## 2016-06-06 NOTE — Assessment & Plan Note (Signed)
Will get abd CT to rule out pancreatitis; will also get lipase stat and CBC and LFTs, along with calcium level

## 2016-06-06 NOTE — Assessment & Plan Note (Signed)
Stop jardiance for now; to ER

## 2016-06-06 NOTE — H&P (Signed)
[facility] [place] NAME: Breanna Rogers    MR#:  [id number]  DATE OF BIRTH:  1965-08-30  DATE OF ADMISSION:  06/06/2016  PRIMARY CARE PHYSICIAN: Enid Derry, MD   REQUESTING/REFERRING PHYSICIAN: Dr. Cinda Quest  CHIEF COMPLAINT:   Chief Complaint  Patient presents with  . Abdominal Pain    HISTORY OF PRESENT ILLNESS:  Breanna Rogers  is a 51 y.o. female with a known history of Diabetes, hyperlipidemia presents to the emergency room sent in by her primary care physician due to worsening epigastric pain. Patient was seen at [facility] emergency room on 06/01/2016 for similar symptoms. Had elevated lipase at 407. Had ultrasound of the right upper quadrant checked which showed no gallstones but only some gallbladder sludge. Liver function tests were normal. She was diagnosed with gastritis and started on Prilosec daily and discharged home. Today she went to see her primary care physician due to worsening symptoms and sent to ER. Here her lipase is elevated to 900. Afebrile. CT scan of the abdomen is pending. Due to worsening symptoms and worsening lipase level patient is being admitted to the hospitalist service.  Her last alcohol was in year back. Triglycerides checked in January 2018 were 250. No gallstones on ultrasound of right upper quadrant checked 5 days back.  PAST MEDICAL HISTORY:   Past Medical History:  Diagnosis Date  . Diabetes mellitus type 2 with retinopathy ([place])   . Elevated transaminase level   . Hot flashes   . Hyperlipidemia   . 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[place]) 4/15, 10/15   seen every 6 months  . Numbness of foot   . Obesity   . Spasm of muscle   . Vitamin D deficiency     PAST SURGICAL HISTORY:   Past Surgical History:  Procedure Laterality Date  . CERVICAL FUSION  2013   C5-7 at [facility], (ACDF C5-6 with removal of hardware)  . TONSILLECTOMY AND ADENOIDECTOMY  1976  . TUBAL LIGATION  1990    SOCIAL HISTORY:    Social History  Substance Use Topics  . Smoking status: Former Smoker    Packs/day: 1.00    Years: 35.00    Types: Cigarettes    Quit date: 03/28/2010  . Smokeless tobacco: Never Used  . Alcohol use No    FAMILY HISTORY:   Family History  Problem Relation Age of Onset  . Cancer Mother     cervical  . Asthma Mother   . Diabetes Mother   . Heart disease Mother   . Hyperlipidemia Mother   . Heart disease Father   . Hyperlipidemia Father   . Hypertension Father   . Diabetes Sister   . Thyroid disease Sister   . Cancer Sister   . Diabetes Brother   . Hypertension Brother   . COPD Sister   . Stroke Neg Hx     DRUG ALLERGIES:   Allergies  Allergen Reactions  . Sulfa Antibiotics     REVIEW OF SYSTEMS:   Review of Systems  Constitutional: Positive for malaise/fatigue. Negative for chills and fever.  HENT: Negative for hearing loss and nosebleeds.   Eyes: Negative for blurred vision, double vision and pain.  Respiratory: Negative for cough, hemoptysis, sputum production, shortness of breath and wheezing.   Cardiovascular: Negative for chest pain, palpitations, orthopnea and leg swelling.  Gastrointestinal: Positive for abdominal pain and nausea. Negative for constipation, diarrhea and vomiting.  Genitourinary: Negative for dysuria and hematuria.  Musculoskeletal:  Negative for back pain, falls and myalgias.  Skin: Negative for rash.  Neurological: Positive for weakness. Negative for dizziness, tremors, sensory change, speech change, focal weakness, seizures and headaches.  Endo/Heme/Allergies: Does not bruise/bleed easily.  Psychiatric/Behavioral: Negative for depression and memory loss. The patient is not nervous/anxious.     MEDICATIONS AT HOME:   Prior to Admission medications   Medication Sig Start Date End Date Taking? Authorizing Provider  JARDIANCE 10 MG TABS tablet Take 10 mg by mouth daily. 06/01/16  Yes Historical Provider, MD  aspirin EC 81 MG tablet Take  81 mg by mouth daily.    Historical Provider, MD  atorvastatin (LIPITOR) 10 MG tablet TAKE 1 TABLET(10 MG) BY MOUTH AT BEDTIME 03/29/16   Arnetha Courser, MD  blood glucose meter kit and supplies KIT E11.329; LON 99 months; check FSBS three times a day while on insulin, then once a day off insulin 07/08/15   Arnetha Courser, MD  cetirizine (ZYRTEC) 10 MG tablet Take 10 mg by mouth daily.    Historical Provider, MD  cyclobenzaprine (FLEXERIL) 5 MG tablet Take 1 tablet (5 mg total) by mouth 3 (three) times daily as needed for muscle spasms. Patient not taking: Reported on 06/06/2016 09/21/15   Arnetha Courser, MD  JANUVIA 100 MG tablet TAKE 1 TABLET(100 MG) BY MOUTH DAILY 04/17/16   Arnetha Courser, MD  lisinopril (PRINIVIL,ZESTRIL) 5 MG tablet TAKE 1 TABLET BY MOUTH DAILY 04/17/16   Arnetha Courser, MD  metFORMIN (GLUCOPHAGE-XR) 500 MG 24 hr tablet TAKE 4 TABLETS(2000 MG) BY MOUTH EVERY EVENING 05/06/16   Arnetha Courser, MD  omeprazole (PRILOSEC) 20 MG capsule Take 20 mg by mouth. 06/01/16 06/15/16  Historical Provider, MD  ONE TOUCH ULTRA TEST test strip USE TO CHECK BLOOD SUGAR THREE TIMES DAILY WHILE ON INSULIN AND EVERY DAY WHILE OFF INSULIN 08/28/15   Arnetha Courser, MD  ONETOUCH DELICA LANCETS FINE MISC USE TO CHECK BLOOD SUGAR THREE TIMES DAILY WHILE ON INSULIN AND EVERY DAY WHILE OFF INSULIN 07/27/15   Arnetha Courser, MD  sertraline (ZOLOFT) 100 MG tablet Take 2 tablets (200 mg total) by mouth daily. 04/04/16   Arnetha Courser, MD     VITAL SIGNS:  Blood pressure 120/74, pulse 94, temperature 99.4 F (37.4 C), temperature source Oral, resp. rate 20, height '5\' 10"'$  (1.778 m), weight 93 kg (205 lb), SpO2 96 %.  PHYSICAL EXAMINATION:  Physical Exam  GENERAL:  51 y.o.-year-old patient lying in the bed with no acute distress.  EYES: Pupils equal, round, reactive to light and accommodation. No scleral icterus. Extraocular muscles intact.  HEENT: Head atraumatic, normocephalic. Oropharynx and nasopharynx clear. No  oropharyngeal erythema, moist oral mucosa  NECK:  Supple, no jugular venous distention. No thyroid enlargement, no tenderness.  LUNGS: Normal breath sounds bilaterally, no wheezing, rales, rhonchi. No use of accessory muscles of respiration.  CARDIOVASCULAR: S1, S2 normal. No murmurs, rubs, or gallops.  ABDOMEN: Soft,  nondistended. Bowel sounds present. No organomegaly or mass. Epigastric abdominal tenderness. EXTREMITIES: No pedal edema, cyanosis, or clubbing. + 2 pedal & radial pulses b/l.   NEUROLOGIC: Cranial nerves II through XII are intact. No focal Motor or sensory deficits appreciated b/l PSYCHIATRIC: The patient is alert and oriented x 3. Good affect.  SKIN: No obvious rash, lesion, or ulcer.   LABORATORY PANEL:   CBC  Recent Labs Lab 06/06/16 1243  WBC 9.5  HGB 14.1  HCT 41.6  PLT 264   ------------------------------------------------------------------------------------------------------------------  Chemistries   Recent Labs Lab 06/06/16 1243  NA 136  K 4.2  CL 98*  CO2 27  GLUCOSE 141*  BUN 15  CREATININE 0.75  CALCIUM 9.9  AST 24  ALT 30  ALKPHOS 70  BILITOT 0.7   ------------------------------------------------------------------------------------------------------------------  Cardiac Enzymes No results for input(s): TROPONINI in the last 168 hours. ------------------------------------------------------------------------------------------------------------------  RADIOLOGY:  No results found.   IMPRESSION AND PLAN:   * Acute pancreatitis of unknown etiology Does not drink alcohol. Triglycerides checked 2 months back with Foley. Will repeat lipid panel. Fluid bolus. Continue IV fluids. Liquid diet. Pain medications as needed CT scan of the abdomen and pelvis is pending. Lipase has increased from 400-900 and the last 4 days. Worsening symptoms. No gallstones on recent ultrasound of the abdomen checked at [facility]. Had gallbladder sludge. LFTs  normal.  * Diabetes mellitus type 2. Continue metformin. Hold Januvia. Sliding scale insulin.  * Hyperlipidemia. Continue Lipitor.  * DVT prophylaxis with Lovenox  All the records are reviewed and case discussed with ED provider. Management plans discussed with the patient, family and they are in agreement.  CODE STATUS: FULL  TOTAL TIME TAKING CARE OF THIS PATIENT: 40 minutes.   Hillary Bow R M.D on 06/06/2016 at 3:58 PM  Between 7am to 6pm - Pager - [phone]  After 6pm go to [email or web address] - password [email or web address]  Office  [phone]  CC: Primary care physician; Enid Derry, MD  Note: This dictation was prepared with Dragon dictation along with smaller phrase technology. Any transcriptional errors that result from this process are unintentional.

## 2016-06-06 NOTE — ED Notes (Signed)
First nurse note. Sent in by PCP with abd pain   Had some abnormal labs at [facility]  Possible pancreatitis

## 2016-06-06 NOTE — ED Provider Notes (Signed)
[facility] Emergency Department Provider Note   ____________________________________________   First MD Initiated Contact with Patient 06/06/16 1516     (approximate)  I have reviewed the triage vital signs and the nursing notes.   HISTORY  Chief Complaint Abdominal Pain    HPI Breanna Rogers is a 51 y.o. female history of abdominal pain for 6 days. She reports it's worse when she eats. Began after eating. Patient is never had this before. He is in the upper abdomen moderate to severe   Past Medical History:  Diagnosis Date  . Diabetes mellitus type 2 with retinopathy ([place])   . Elevated transaminase level   . Hot flashes   . Hyperlipidemia   . 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[place]) 4/15, 10/15   seen every 6 months  . Numbness of foot   . Obesity   . Spasm of muscle   . Vitamin D deficiency     Patient Active Problem List   Diagnosis Date Noted  . Abdominal pain, RUQ 06/06/2016  . Hyperproteinemia 04/05/2016  . Urine test positive for microalbuminuria 07/08/2015  . Mild major depression ([place]) 07/08/2015  . Medication monitoring encounter 06/26/2015  . Dyslipidemia 06/12/2015  . Hx of iron deficiency anemia 05/07/2015  . Nocturia 05/07/2015  . Screening for HIV (human immunodeficiency virus) 05/07/2015  . Breast cancer screening 05/07/2015  . Fecal incontinence 05/07/2015  . Diabetes mellitus type 2 with retinopathy ([place])   . 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[place])   . Obesity     Past Surgical History:  Procedure Laterality Date  . CERVICAL FUSION  2013   C5-7 at [facility], (ACDF C5-6 with removal of hardware)  . TONSILLECTOMY AND ADENOIDECTOMY  1976  . TUBAL LIGATION  1990    Prior to Admission medications   Medication Sig Start Date End Date Taking? Authorizing Provider  aspirin EC 81 MG tablet Take 81 mg by mouth daily.    Historical Provider, MD  atorvastatin (LIPITOR) 10 MG tablet TAKE 1 TABLET(10 MG) BY  MOUTH AT BEDTIME 03/29/16   Arnetha Courser, MD  blood glucose meter kit and supplies KIT E11.329; LON 99 months; check FSBS three times a day while on insulin, then once a day off insulin 07/08/15   Arnetha Courser, MD  cetirizine (ZYRTEC) 10 MG tablet Take 10 mg by mouth daily.    Historical Provider, MD  cyclobenzaprine (FLEXERIL) 5 MG tablet Take 1 tablet (5 mg total) by mouth 3 (three) times daily as needed for muscle spasms. Patient not taking: Reported on 06/06/2016 09/21/15   Arnetha Courser, MD  JANUVIA 100 MG tablet TAKE 1 TABLET(100 MG) BY MOUTH DAILY 04/17/16   Arnetha Courser, MD  lisinopril (PRINIVIL,ZESTRIL) 5 MG tablet TAKE 1 TABLET BY MOUTH DAILY 04/17/16   Arnetha Courser, MD  metFORMIN (GLUCOPHAGE-XR) 500 MG 24 hr tablet TAKE 4 TABLETS(2000 MG) BY MOUTH EVERY EVENING 05/06/16   Arnetha Courser, MD  omeprazole (PRILOSEC) 20 MG capsule Take 20 mg by mouth. 06/01/16 06/15/16  Historical Provider, MD  ONE TOUCH ULTRA TEST test strip USE TO CHECK BLOOD SUGAR THREE TIMES DAILY WHILE ON INSULIN AND EVERY DAY WHILE OFF INSULIN 08/28/15   Arnetha Courser, MD  ONETOUCH DELICA LANCETS FINE MISC USE TO CHECK BLOOD SUGAR THREE TIMES DAILY WHILE ON INSULIN AND EVERY DAY WHILE OFF INSULIN 07/27/15   Arnetha Courser, MD  sertraline (ZOLOFT) 100 MG tablet Take 2 tablets (200 mg total) by  mouth daily. 04/04/16   Melinda P Lada, MD    Allergies Sulfa antibiotics  Family History  Problem Relation Age of Onset  . Cancer Mother     cervical  . Asthma Mother   . Diabetes Mother   . Heart disease Mother   . Hyperlipidemia Mother   . Heart disease Father   . Hyperlipidemia Father   . Hypertension Father   . Diabetes Sister   . Thyroid disease Sister   . Cancer Sister   . Diabetes Brother   . Hypertension Brother   . COPD Sister   . Stroke Neg Hx     Social History Social History  Substance Use Topics  . Smoking status: Former Smoker    Packs/day: 1.00    Years: 35.00    Types: Cigarettes    Quit date:  03/28/2010  . Smokeless tobacco: Never Used  . Alcohol use No    Review of Systems Constitutional: No fever/chills Eyes: No visual changes. ENT: No sore throat. Cardiovascular: Denies chest pain. Respiratory: Denies shortness of breath. Gastrointestinal:abdominal pain.  No nausea, no vomiting.  No diarrhea.  No constipation. Genitourinary: Negative for dysuria. Musculoskeletal: Negative for back pain. Skin: Negative for rash. Neurological: Negative for headaches, focal weakness or numbness.  10-point ROS otherwise negative.  ____________________________________________   PHYSICAL EXAM:  VITAL SIGNS: ED Triage Vitals  Enc Vitals Group     BP 06/06/16 1238 120/74     Pulse Rate 06/06/16 1238 94     Resp 06/06/16 1238 20     Temp 06/06/16 1238 99.4 F (37.4 C)     Temp Source 06/06/16 1238 Oral     SpO2 06/06/16 1238 96 %     Weight 06/06/16 1239 205 lb (93 kg)     Height 06/06/16 1239 5' 10" (1.778 m)     Head Circumference --      Peak Flow --      Pain Score 06/06/16 1239 5     Pain Loc --      Pain Edu? --      Excl. in GC? --     Constitutional: Alert and oriented. Well appearing and in no acute distress. Eyes: Conjunctivae are normal. PERRL. EOMI. Head: Atraumatic. Nose: No congestion/rhinnorhea. Mouth/Throat: Mucous membranes are moist.  Oropharynx non-erythematous. Neck: No stridor.   Cardiovascular: Normal rate, regular rhythm. Grossly normal heart sounds.  Good peripheral circulation. Respiratory: Normal respiratory effort.  No retractions. Lungs CTAB. Gastrointestinal: Soft but tender in the mid abdomen and upper abdomen. No distention. No abdominal bruits. No CVA tenderness. Musculoskeletal: No lower extremity tenderness nor edema.  No joint effusions. Neurologic:  Normal speech and language. No gross focal neurologic deficits are appreciated. No gait instability. Skin:  Skin is warm, dry and intact. No rash  noted.   ____________________________________________   LABS (all labs ordered are listed, but only abnormal results are displayed)  Labs Reviewed  LIPASE, BLOOD - Abnormal; Notable for the following:       Result Value   Lipase 998 (*)    All other components within normal limits  COMPREHENSIVE METABOLIC PANEL - Abnormal; Notable for the following:    Chloride 98 (*)    Glucose, Bld 141 (*)    Total Protein 8.6 (*)    All other components within normal limits  CBC  PREGNANCY, URINE   ____________________________________________  EKG   ____________________________________________  RADIOLOGY   ____________________________________________   PROCEDURES  Procedure(s) p  Procedures  Critical   Care performe_____________________   INITIAL IMPRESSION / ASSESSMENT AND PLAN / ED COURSE  Pertinent labs & imaging results that were available during my care of the patient were reviewed by me and considered in my medical decision making (see chart for details).        ____________________________________________   FINAL CLINICAL IMPRESSION(S) / ED DIAGNOSES  Final diagnoses:  Acute pancreatitis, unspecified complication status, unspecified pancreatitis type      NEW MEDICATIONS STARTED DURING THIS VISIT:  New Prescriptions   No medications on file     Note:  This document was prepared using Dragon voice recognition software and may include unintentional dictation errors.    Nena Polio, MD 06/06/16 [phone]

## 2016-06-06 NOTE — Progress Notes (Signed)
BP 118/72   Pulse 98   Temp 98.4 F (36.9 C)   Resp 16   Wt 209 lb 2 oz (94.9 kg)   SpO2 97%   BMI 30.88 kg/m    Subjective:    Patient ID: Breanna Rogers, female    DOB: 01/31/1966, 51 y.o.   MRN: [id number]  HPI: Breanna Rogers is a 51 y.o. female  Chief Complaint  Patient presents with  . Hospitalization Follow-up    [place]. Went to the [place].    Patient is here for ER f/u abdominal pain Patient had epigastric pain immediately after following meal of spaghetti last Tuesday, 10 out of 10 pain radiating to back; pain eased then came back in the morning; called [place] and was instructed to go to ER  Patient went to the ER on 06/01/2016, [facility] notes in [place] reviewed ddx STEMI, NSTEMI, angina, gastroenteritis, cholecystitis, pancreatitis, GERD US showed mild sludge no signs of cholecystitis ------------------------------------------------------ Radiology  US Abdomen Complete (Preliminary result)  Result time 06/01/16 17:32:02  Preliminary result by Laverta Baltimore, MD (06/01/16 17:32:02)  Impression:  --Small volume sludge within the mildly hydropic gallbladder. --There may be scattered aortic calcification, however the visualized aorta is of normal caliber. --Echogenic hepatic parenchyma is nonspecific but may reflect steatosis. ---------------------------------------------------------  Lipase was over 400 Two troponins were negative Elevated calcium of 10.7 Total protein and albumin both mildly elevated LE urine was "small" but she had 10 epis  Today, she is hurting in the epigastrium and just right of midline, pain radiates to the back Pain is a 5 out of 10 Some nausea, no vomiting Normal brown stools No fevers She is on Jardiance for her diabetes No hx of pancreatitis in the past  Depression screen [facility] 2/9 06/06/2016 04/04/2016 09/21/2015 07/08/2015 05/07/2015  Decreased Interest 0 0 0 0 0  Down, Depressed, Hopeless 0 0 0 0 0  PHQ - 2 Score 0 0  0 0 0   Relevant past medical, surgical, family and social history reviewed Past Medical History:  Diagnosis Date  . Diabetes mellitus type 2 with retinopathy ([place])   . Elevated transaminase level   . Hot flashes   . Hyperlipidemia   . 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[place]) 4/15, 10/15   seen every 6 months  . Numbness of foot   . Obesity   . Spasm of muscle   . Vitamin D deficiency    Past Surgical History:  Procedure Laterality Date  . CERVICAL FUSION  2013   C5-7 at [facility], (ACDF C5-6 with removal of hardware)  . TONSILLECTOMY AND ADENOIDECTOMY  1976  . TUBAL LIGATION  1990   Family History  Problem Relation Age of Onset  . Cancer Mother     cervical  . Asthma Mother   . Diabetes Mother   . Heart disease Mother   . Hyperlipidemia Mother   . Heart disease Father   . Hyperlipidemia Father   . Hypertension Father   . Diabetes Sister   . Thyroid disease Sister   . Cancer Sister   . Diabetes Brother   . Hypertension Brother   . Stroke Neg Hx   . COPD Sister    Social History  Substance Use Topics  . Smoking status: Former Smoker    Packs/day: 1.00    Years: 35.00    Types: Cigarettes    Quit date: 03/28/2010  . Smokeless tobacco: Never Used  . Alcohol use No  Interim medical history since last visit reviewed. Allergies and medications reviewed  Review of Systems Per HPI unless specifically indicated above     Objective:    BP 118/72   Pulse 98   Temp 98.4 F (36.9 C)   Resp 16   Wt 209 lb 2 oz (94.9 kg)   SpO2 97%   BMI 30.88 kg/m   Wt Readings from Last 3 Encounters:  06/06/16 209 lb 2 oz (94.9 kg)  04/04/16 222 lb 3 oz (100.8 kg)  09/21/15 228 lb (103.4 kg)    Physical Exam  Constitutional: She appears well-developed and well-nourished. No distress (but obviously doesn't feel well; not her usual self).  Eyes: EOM are normal. No scleral icterus.  Neck: No thyromegaly present.  Cardiovascular: Normal rate and regular rhythm.     Pulmonary/Chest: Effort normal.  Abdominal: Soft. Bowel sounds are normal. She exhibits no distension. There is tenderness in the right upper quadrant and epigastric area.    Tender to palpation, radiates through to her back  Skin: She is not diaphoretic. No pallor.  No jaundice  Psychiatric: She has a normal mood and affect. Her behavior is normal. Judgment and thought content normal. Her mood appears not anxious.   Diabetic Foot Form - Detailed   Diabetic Foot Exam - detailed Diabetic Foot exam was performed with the following findings:  Yes 06/06/2016 12:24 PM  Are the toenails ingrown?:  No Normal Range of Motion:  Yes Pulse Foot Exam completed.:  Yes  Right Dorsalis Pedis:  Present Left Dorsalis Pedis:  Present  Sensory Foot Exam Completed.:  Yes Swelling:  No Semmes-Weinstein Monofilament Test R Site 1-Great Toe:  Pos L Site 1-Great Toe:  Pos  R Site 4:  Pos L Site 4:  Pos  R Site 5:  Pos L Site 5:  Pos           Assessment & Plan:   Problem List Items Addressed This Visit      Endocrine   Diabetes mellitus type 2 with retinopathy ([place])    Stop jardiance for now; to ER        Other   Abdominal pain, RUQ    Will get abd CT to rule out pancreatitis; will also get lipase stat and CBC and LFTs, along with calcium level       Other Visit Diagnoses    Abdominal pain, epigastric    -  Primary   reviewed ER notes, labs, [place] report; ddx includes pancreatitis; STOP jardiance; go to ER now; check-out given to charge nurse   Screening for colon cancer       Relevant Orders   Ambulatory referral to Gastroenterology   Screening for breast cancer       Relevant Orders   MM Digital Screening      Follow up plan: No Follow-up on file.  An after-visit summary was printed and given to the patient at [place].  Please see the patient instructions which may contain other information and recommendations beyond what is mentioned above in the assessment and plan.  Meds  ordered this encounter  Medications  . omeprazole (PRILOSEC) 20 MG capsule    Sig: Take 20 mg by mouth.    Orders Placed This Encounter  Procedures  . MM Digital Screening  . Ambulatory referral to Gastroenterology

## 2016-06-06 NOTE — ED Triage Notes (Signed)
Abdominal pain x 6 days. Had lab work and was told she had elevated lipase.

## 2016-06-06 NOTE — ED Notes (Signed)
Patient transported to CT 

## 2016-06-07 ENCOUNTER — Inpatient Hospital Stay: Payer: Managed Care, Other (non HMO)

## 2016-06-07 LAB — CBC
HCT: 40.2 % (ref 35.0–47.0)
Hemoglobin: 13.5 g/dL (ref 12.0–16.0)
MCH: 30.5 pg (ref 26.0–34.0)
MCHC: 33.4 g/dL (ref 32.0–36.0)
MCV: 91.3 fL (ref 80.0–100.0)
PLATELETS: 223 10*3/uL (ref 150–440)
RBC: 4.41 MIL/uL (ref 3.80–5.20)
RDW: 14.2 % (ref 11.5–14.5)
WBC: 8.1 10*3/uL (ref 3.6–11.0)

## 2016-06-07 LAB — URINALYSIS, COMPLETE (UACMP) WITH MICROSCOPIC
BACTERIA UA: NONE SEEN
Bilirubin Urine: NEGATIVE
Glucose, UA: >=500 mg/dL — AB
HGB URINE DIPSTICK: NEGATIVE
KETONES UR: 20 mg/dL — AB
NITRITE: NEGATIVE
PROTEIN: NEGATIVE mg/dL
Specific Gravity, Urine: 1.026 (ref 1.005–1.030)
pH: 5 (ref 5.0–8.0)

## 2016-06-07 LAB — LIPASE, BLOOD: Lipase: 1149 U/L — ABNORMAL HIGH (ref 11–51)

## 2016-06-07 LAB — GLUCOSE, CAPILLARY
GLUCOSE-CAPILLARY: 106 mg/dL — AB (ref 65–99)
GLUCOSE-CAPILLARY: 103 mg/dL — AB (ref 65–99)
Glucose-Capillary: 106 mg/dL — ABNORMAL HIGH (ref 65–99)
Glucose-Capillary: 95 mg/dL (ref 65–99)

## 2016-06-07 LAB — BASIC METABOLIC PANEL
ANION GAP: 8 (ref 5–15)
BUN: 11 mg/dL (ref 6–20)
CALCIUM: 9.3 mg/dL (ref 8.9–10.3)
CO2: 26 mmol/L (ref 22–32)
CREATININE: 0.63 mg/dL (ref 0.44–1.00)
Chloride: 103 mmol/L (ref 101–111)
GLUCOSE: 94 mg/dL (ref 65–99)
Potassium: 4.2 mmol/L (ref 3.5–5.1)
Sodium: 137 mmol/L (ref 135–145)

## 2016-06-07 MED ORDER — INSULIN ASPART 100 UNIT/ML ~~LOC~~ SOLN: 0.0000 [IU] | Freq: Every day | SUBCUTANEOUS | Status: DC

## 2016-06-07 MED ORDER — BOOST / RESOURCE BREEZE PO LIQD
1.0000 | Freq: Three times a day (TID) | ORAL | Status: DC
  Administered 2016-06-07 – 2016-06-08 (×2): 1 via ORAL

## 2016-06-07 MED ORDER — INSULIN ASPART 100 UNIT/ML ~~LOC~~ SOLN
0.0000 [IU] | Freq: Three times a day (TID) | SUBCUTANEOUS | Status: DC
Start: 2016-06-07 — End: 2016-06-08
  Administered 2016-06-08: 3 [IU] via SUBCUTANEOUS
  Filled 2016-06-07: qty 3

## 2016-06-07 MED ORDER — KETOROLAC TROMETHAMINE 30 MG/ML IJ SOLN
30.0000 mg | Freq: Four times a day (QID) | INTRAMUSCULAR | Status: DC | PRN
  Administered 2016-06-07: 30 mg via INTRAVENOUS
  Filled 2016-06-07: qty 1

## 2016-06-07 MED ORDER — METOCLOPRAMIDE HCL 5 MG/ML IJ SOLN
10.0000 mg | Freq: Four times a day (QID) | INTRAMUSCULAR | Status: DC | PRN
  Administered 2016-06-07: 10 mg via INTRAVENOUS
  Filled 2016-06-07: qty 2

## 2016-06-07 NOTE — Progress Notes (Signed)
Initial Nutrition Assessment  DOCUMENTATION CODES:   Obesity unspecified  INTERVENTION:  Provide Boost Breeze po TID, each supplement provides 250 kcal and 9 grams of protein.  Reviewed pancreatitis nutrition therapy with patient and husband. Described function of pancreas and importance of eating lower fat options. Encouraged intake of low-fat or non-fat foods from each food group as diet advanced.  NUTRITION DIAGNOSIS:   Unintentional weight loss related to acute illness, poor appetite, nausea (pancreatitis) as evidenced by per patient/family report, 4.9 percent weight loss over 1-2 weeks.  GOAL:   Patient will meet greater than or equal to 90% of their needs  MONITOR:   PO intake, Supplement acceptance, Diet advancement, Labs, Weight trends, I & O's  REASON FOR ASSESSMENT:   Malnutrition Screening Tool    ASSESSMENT:   51 year old female with PMHx of DM Type 2, HLD who presented with worsening epigastric pain found to have acute pancreatitis.     Spoke with patient and husband at bedside. Patient reports she has had abdominal pain and N/V for approximately 1 week now. Denies constipation/diarrhea. She has been afraid to eat certain foods because of the pain, but patient unable to provide specifics. She reports she has been still attempting to eat 3 meals per day but only finishing approximately 50% of meals this week. Patient reports she had some strawberry yogurt for dinner last night and again for breakfast today but that has been all she could tolerate.   Patient unsure of UBW, only knows it is over 200 lbs. She believes she has lost at least 5 lbs. Per chart patient was 222.2 lbs on 04/04/2016 and has lost 10.9 lbs (4.9% body weight) likely over the past 1-2 weeks per patient report, which is significant for time frame.   Medications reviewed and include: Novolog sliding scale TID with meals and daily at bedtime, pantoprazole, NS @ 100 ml/hr, Reglan PRN, Zofran PRN.  Labs  reviewed: CBG 95-112, Lipase 1149. TG 187 on 3/12, LFTs WNL on 3/12.  Nutrition-Focused physical exam completed. Findings are no fat depletion, no muscle depletion, and no edema.   Patient at high risk for acute malnutrition in setting of 4.9% weight loss over 1-2 weeks. Does not meet criteria for malnutrition at this time due to limited information on energy intake and no wasting on physical exam.  Diet Order:  Diet clear liquid Room service appropriate? Yes; Fluid consistency: Thin  Skin:  Reviewed, no issues  Last BM:  06/06/2016  Height:   Ht Readings from Last 1 Encounters:  06/06/16 5\' 10"  (1.778 m)    Weight:   Wt Readings from Last 1 Encounters:  06/07/16 211 lb 6.4 oz (95.9 kg)    Ideal Body Weight:  68.2 kg  BMI:  Body mass index is 30.33 kg/m.  Estimated Nutritional Needs:   Kcal:  7616-0737 (MSJ x 1.3-1.4)  Protein:  95-115 grams (1-1.2 grams/kg)  Fluid:  2.1-2.3 L/day  EDUCATION NEEDS:   Education needs addressed  Willey Blade, MS, RD, LDN Pager: [phone] After Hours Pager: [phone]

## 2016-06-07 NOTE — Progress Notes (Addendum)
[facility] [place] NAME: Breanna Rogers    MR#:  [id number]  DATE OF BIRTH:  [birth year]  SUBJECTIVE:  CHIEF COMPLAINT:   Chief Complaint  Patient presents with  . Abdominal Pain   The patient is still has nausea, vomiting and abdominal pain. REVIEW OF SYSTEMS:  Review of Systems  Constitutional: Positive for malaise/fatigue. Negative for chills and fever.  HENT: Negative for congestion.   Eyes: Negative for blurred vision and double vision.  Respiratory: Negative for cough, shortness of breath, wheezing and stridor.   Cardiovascular: Negative for chest pain and leg swelling.  Gastrointestinal: Positive for abdominal pain, nausea and vomiting. Negative for blood in stool, diarrhea and melena.  Genitourinary: Negative for dysuria and hematuria.  Musculoskeletal: Positive for back pain.  Skin: Negative for itching and rash.  Neurological: Negative for dizziness, focal weakness and loss of consciousness.  Psychiatric/Behavioral: Negative for depression. The patient is not nervous/anxious.     DRUG ALLERGIES:   Allergies  Allergen Reactions  . Sulfa Antibiotics    VITALS:  Blood pressure (!) 111/58, pulse 88, temperature 99.9 F (37.7 C), temperature source Oral, resp. rate 18, height 5\' 10"  (1.778 m), weight 211 lb 6.4 oz (95.9 kg), SpO2 95 %. PHYSICAL EXAMINATION:  Physical Exam  Constitutional: She is oriented to person, place, and time and well-developed, well-nourished, and in no distress.  HENT:  Head: Normocephalic.  Mouth/Throat: Oropharynx is clear and moist.  Eyes: Conjunctivae and EOM are normal.  Neck: Normal range of motion. Neck supple. No JVD present. No tracheal deviation present.  Cardiovascular: Normal rate and regular rhythm.  Exam reveals no gallop.   No murmur heard. Pulmonary/Chest: Effort normal and breath sounds normal. No respiratory distress. She has no wheezes. She has no rales.  Abdominal: Soft. Bowel  sounds are normal. She exhibits no distension. There is tenderness.  Musculoskeletal: Normal range of motion. She exhibits no edema or tenderness.  Neurological: She is alert and oriented to person, place, and time. No cranial nerve deficit.  Skin: No rash noted. No erythema.  Psychiatric: Affect and judgment normal.   LABORATORY PANEL:  Female CBC  Recent Labs Lab 06/07/16 0412  WBC 8.1  HGB 13.5  HCT 40.2  PLT 223   ------------------------------------------------------------------------------------------------------------------ Chemistries   Recent Labs Lab 06/06/16 1243 06/07/16 0412  NA 136 137  K 4.2 4.2  CL 98* 103  CO2 27 26  GLUCOSE 141* 94  BUN 15 11  CREATININE 0.75 0.63  CALCIUM 9.9 9.3  AST 24  --   ALT 30  --   ALKPHOS 70  --   BILITOT 0.7  --    RADIOLOGY:  Ct Abdomen Pelvis W Contrast  Result Date: 06/06/2016 CLINICAL DATA:  Abdominal pain and nausea for 6 days, especially postprandial. EXAM: CT ABDOMEN AND PELVIS WITH CONTRAST TECHNIQUE: Multidetector CT imaging of the abdomen and pelvis was performed using the standard protocol following bolus administration of intravenous contrast. CONTRAST:  162mL ISOVUE-300 IOPAMIDOL (ISOVUE-300) INJECTION 61% COMPARISON:  None. FINDINGS: Lower chest: Borderline cardiomegaly. Mild distal esophageal wall thickening. Hepatobiliary: Unremarkable Pancreas: Subtle peripancreatic stranding. No findings of pseudocyst, abscess, or necrosis. Spleen: Unremarkable Stomach/Bowel: Upper normal amount of stool in the colon. Vascular/Lymphatic: Aortoiliac atherosclerotic vascular disease. Reproductive: Unremarkable Other: No supplemental non-categorized findings. Musculoskeletal: Chronic left unilateral pars defect at L5, with sclerosis in the right pars region but no overt pars defect on the right. No significant subluxation. IMPRESSION: 1. Subtle  peripancreatic stranding suggesting mild acute pancreatitis. No findings of pseudocyst,  abscess, or necrosis. 2. Borderline appearance for constipation with upper normal amount of stool in the colon. 3.  Aortoiliac atherosclerotic vascular disease. 4. Chronic left unilateral pars defect at L5 without subluxation. 5. Mild distal esophageal wall thickening, possibly from esophagitis. 6. Borderline cardiomegaly. Electronically Signed   By: Van Clines M.D.   On: 06/06/2016 17:02   ASSESSMENT AND PLAN:   * Acute pancreatitis of unknown etiology Does not drink alcohol. Triglycerides 187.  Continue IV fluids. Hold Liquid diet. NPO except meds. May start clear liquid diet if patient want. Pain medications as needed CT scan of the abdomen and pelvis: mild pancreatitis. Lipase has increased to 1149. No gallstones on recent ultrasound of the abdomen checked at [facility]. Had gallbladder sludge. LFTs normal.  * Diabetes mellitus type 2. hold metformin and Januvia. Sliding scale insulin.  * Hyperlipidemia. Continue Lipitor.  All the records are reviewed and case discussed with Care Management/Social Worker. Management plans discussed with the patient, Her husband and they are in agreement.  CODE STATUS: Full Code  TOTAL TIME TAKING CARE OF THIS PATIENT: 35 minutes.   More than 50% of the time was spent in counseling/coordination of care: YES  POSSIBLE D/C IN 2 DAYS, DEPENDING ON CLINICAL CONDITION.   Demetrios Loll M.D on 06/07/2016 at 3:09 PM  Between 7am to 6pm - Pager - [phone]  After 6pm go to [email or web address] - Proofreader  [facility]  Office  [phone]  CC: Primary care physician; Enid Derry, MD  Note: This dictation was prepared with Dragon dictation along with smaller phrase technology. Any transcriptional errors that result from this process are unintentional.

## 2016-06-08 ENCOUNTER — Other Ambulatory Visit: Payer: Self-pay | Admitting: Family Medicine

## 2016-06-08 ENCOUNTER — Encounter: Payer: Self-pay | Admitting: Family Medicine

## 2016-06-08 LAB — GLUCOSE, CAPILLARY
Glucose-Capillary: 116 mg/dL — ABNORMAL HIGH (ref 65–99)
Glucose-Capillary: 250 mg/dL — ABNORMAL HIGH (ref 65–99)

## 2016-06-08 LAB — LIPASE, BLOOD: Lipase: 299 U/L — ABNORMAL HIGH (ref 11–51)

## 2016-06-08 MED ORDER — SODIUM CHLORIDE 0.9 % IV SOLN
INTRAVENOUS | Status: DC
  Administered 2016-06-08 (×2): via INTRAVENOUS

## 2016-06-08 NOTE — Discharge Instructions (Signed)
Heart healthy and ADA diet. °

## 2016-06-08 NOTE — Discharge Summary (Signed)
[place] [facility] [place] NAME: Breanna Rogers    MR#:  [id number]  DATE OF BIRTH:  1965/06/21  DATE OF ADMISSION:  06/06/2016   ADMITTING PHYSICIAN: Hillary Bow, MD  DATE OF DISCHARGE: 06/08/2016 PRIMARY CARE PHYSICIAN: Enid Derry, MD   ADMISSION DIAGNOSIS:  Acute pancreatitis, unspecified complication status, unspecified pancreatitis type [K85.90] DISCHARGE DIAGNOSIS:  Active Problems:   Acute pancreatitis  SECONDARY DIAGNOSIS:   Past Medical History:  Diagnosis Date  . Diabetes mellitus type 2 with retinopathy ([place])   . Elevated transaminase level   . Hot flashes   . Hyperlipidemia   . 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[place]) 4/15, 10/15   seen every 6 months  . Numbness of foot   . Obesity   . Spasm of muscle   . Vitamin D deficiency    HOSPITAL COURSE:   * Acute pancreatitis of unknown etiology Does not drink alcohol. Triglycerides 187. She is treated with IV fluids. NPO except meds. She tolerated liquid diet and low fat diet. Lipase improved. Pain medications as needed CT scan of the abdomen and pelvis: mild pancreatitis. Lipase has increased to 1149.  Lipase improved today. No gallstones on recent ultrasound of the abdomen checked at [facility]. Had gallbladder sludge. LFTs normal.  * Diabetes mellitus type 2. hold metformin and Januvia. Sliding scale insulin.  * Hyperlipidemia. Continue Lipitor.  DISCHARGE CONDITIONS:  Stable, discharge to home today CONSULTS OBTAINED:   DRUG ALLERGIES:   Allergies  Allergen Reactions  . Sulfa Antibiotics    DISCHARGE MEDICATIONS:   Allergies as of 06/08/2016      Reactions   Sulfa Antibiotics       Medication List    TAKE these medications   aspirin EC 81 MG tablet Take 81 mg by mouth daily.   atorvastatin 10 MG tablet Commonly known as:  LIPITOR TAKE 1 TABLET(10 MG) BY MOUTH AT BEDTIME   blood glucose meter kit and supplies Kit E11.329; LON 99 months; check  FSBS three times a day while on insulin, then once a day off insulin   cetirizine 10 MG tablet Commonly known as:  ZYRTEC Take 10 mg by mouth daily.   cyclobenzaprine 5 MG tablet Commonly known as:  FLEXERIL Take 1 tablet (5 mg total) by mouth 3 (three) times daily as needed for muscle spasms.   JANUVIA 100 MG tablet Generic drug:  sitaGLIPtin TAKE 1 TABLET(100 MG) BY MOUTH DAILY   lisinopril 5 MG tablet Commonly known as:  PRINIVIL,ZESTRIL TAKE 1 TABLET BY MOUTH DAILY   metFORMIN 500 MG 24 hr tablet Commonly known as:  GLUCOPHAGE-XR TAKE 4 TABLETS(2000 MG) BY MOUTH EVERY EVENING   ONE TOUCH ULTRA TEST test strip Generic drug:  glucose blood USE TO CHECK BLOOD SUGAR THREE TIMES DAILY WHILE ON INSULIN AND EVERY DAY WHILE OFF INSULIN   ONETOUCH DELICA LANCETS FINE Misc USE TO CHECK BLOOD SUGAR THREE TIMES DAILY WHILE ON INSULIN AND EVERY DAY WHILE OFF INSULIN   sertraline 100 MG tablet Commonly known as:  ZOLOFT Take 2 tablets (200 mg total) by mouth daily.        DISCHARGE INSTRUCTIONS:  See AVS.  If you experience worsening of your admission symptoms, develop shortness of breath, life threatening emergency, suicidal or homicidal thoughts you must seek medical attention immediately by calling 911 or calling your MD immediately  if symptoms less severe.  You Must read complete instructions/literature along with all the possible adverse reactions/side effects  for all the Medicines you take and that have been prescribed to you. Take any new Medicines after you have completely understood and accpet all the possible adverse reactions/side effects.   Please note  You were cared for by a hospitalist during your hospital stay. If you have any questions about your discharge medications or the care you received while you were in the hospital after you are discharged, you can call the unit and asked to speak with the hospitalist on call if the hospitalist that took care of you is  not available. Once you are discharged, your primary care physician will handle any further medical issues. Please note that NO REFILLS for any discharge medications will be authorized once you are discharged, as it is imperative that you return to your primary care physician (or establish a relationship with a primary care physician if you do not have one) for your aftercare needs so that they can reassess your need for medications and monitor your lab values.    On the day of Discharge:  VITAL SIGNS:  Blood pressure 104/79, pulse 84, temperature 98 F (36.7 C), temperature source Oral, resp. rate 18, height 5\' 10"  (1.778 m), weight 215 lb (97.5 kg), SpO2 94 %. PHYSICAL EXAMINATION:  GENERAL:  51 y.o.-year-old patient lying in the bed with no acute distress.  EYES: Pupils equal, round, reactive to light and accommodation. No scleral icterus. Extraocular muscles intact.  HEENT: Head atraumatic, normocephalic. Oropharynx and nasopharynx clear.  NECK:  Supple, no jugular venous distention. No thyroid enlargement, no tenderness.  LUNGS: Normal breath sounds bilaterally, no wheezing, rales,rhonchi or crepitation. No use of accessory muscles of respiration.  CARDIOVASCULAR: S1, S2 normal. No murmurs, rubs, or gallops.  ABDOMEN: Soft, non-tender, non-distended. Bowel sounds present. No organomegaly or mass.  EXTREMITIES: No pedal edema, cyanosis, or clubbing.  NEUROLOGIC: Cranial nerves II through XII are intact. Muscle strength 5/5 in all extremities. Sensation intact. Gait not checked.  PSYCHIATRIC: The patient is alert and oriented x 3.  SKIN: No obvious rash, lesion, or ulcer.  DATA REVIEW:   CBC  Recent Labs Lab 06/07/16 0412  WBC 8.1  HGB 13.5  HCT 40.2  PLT 223    Chemistries   Recent Labs Lab 06/06/16 1243 06/07/16 0412  NA 136 137  K 4.2 4.2  CL 98* 103  CO2 27 26  GLUCOSE 141* 94  BUN 15 11  CREATININE 0.75 0.63  CALCIUM 9.9 9.3  AST 24  --   ALT 30  --   ALKPHOS  70  --   BILITOT 0.7  --      Microbiology Results  Results for orders placed or performed during the hospital encounter of 06/06/16  Culture, blood (Routine X 2) w Reflex to ID Panel     Status: None (Preliminary result)   Collection Time: 06/07/16  9:10 PM  Result Value Ref Range Status   Specimen Description BLOOD LEFT ANTECUBITAL  Final   Special Requests BOTTLES DRAWN AEROBIC AND ANAEROBIC BCAV  Final   Culture NO GROWTH < 12 HOURS  Final   Report Status PENDING  Incomplete  Culture, blood (Routine X 2) w Reflex to ID Panel     Status: None (Preliminary result)   Collection Time: 06/07/16  9:18 PM  Result Value Ref Range Status   Specimen Description BLOOD RIGHT WRIST  Final   Special Requests BOTTLES DRAWN AEROBIC AND ANAEROBIC BCAV  Final   Culture NO GROWTH < 12 HOURS  Final  Report Status PENDING  Incomplete    RADIOLOGY:  Dg Chest Port 1 View  Result Date: 06/07/2016 CLINICAL DATA:  Cough and fever EXAM: PORTABLE CHEST 1 VIEW COMPARISON:  04/20/2012 FINDINGS: Lower cervical hardware. No acute infiltrate or effusion. Normal cardiomediastinal silhouette. No pneumothorax. IMPRESSION: No active disease. Electronically Signed   By: Donavan Foil M.D.   On: 06/07/2016 21:09     Management plans discussed with the patient, her husband and they are in agreement.  CODE STATUS: Full Code   TOTAL TIME TAKING CARE OF THIS PATIENT: 31 minutes.    Demetrios Loll M.D on 06/08/2016 at 1:18 PM  Between 7am to 6pm - Pager - [phone]  After 6pm go to [email or web address] - Proofreader  [facility]  Office  [phone]  CC: Primary care physician; Enid Derry, MD   Note: This dictation was prepared with Dragon dictation along with smaller phrase technology. Any transcriptional errors that result from this process are unintentional.

## 2016-06-08 NOTE — Progress Notes (Signed)
06/08/2016  2:30 PM  Breanna Rogers to be D/C'd Home per MD order.  Discussed prescriptions and follow up appointments with the patient. Prescriptions given to patient, medication list explained in detail. Pt verbalized understanding.  Allergies as of 06/08/2016      Reactions   Sulfa Antibiotics       Medication List    TAKE these medications   aspirin EC 81 MG tablet Take 81 mg by mouth daily.   atorvastatin 10 MG tablet Commonly known as:  LIPITOR TAKE 1 TABLET(10 MG) BY MOUTH AT BEDTIME   blood glucose meter kit and supplies Kit E11.329; LON 99 months; check FSBS three times a day while on insulin, then once a day off insulin   cetirizine 10 MG tablet Commonly known as:  ZYRTEC Take 10 mg by mouth daily.   cyclobenzaprine 5 MG tablet Commonly known as:  FLEXERIL Take 1 tablet (5 mg total) by mouth 3 (three) times daily as needed for muscle spasms.   JANUVIA 100 MG tablet Generic drug:  sitaGLIPtin TAKE 1 TABLET(100 MG) BY MOUTH DAILY   lisinopril 5 MG tablet Commonly known as:  PRINIVIL,ZESTRIL TAKE 1 TABLET BY MOUTH DAILY   metFORMIN 500 MG 24 hr tablet Commonly known as:  GLUCOPHAGE-XR TAKE 4 TABLETS(2000 MG) BY MOUTH EVERY EVENING   ONE TOUCH ULTRA TEST test strip Generic drug:  glucose blood USE TO CHECK BLOOD SUGAR THREE TIMES DAILY WHILE ON INSULIN AND EVERY DAY WHILE OFF INSULIN   ONETOUCH DELICA LANCETS FINE Misc USE TO CHECK BLOOD SUGAR THREE TIMES DAILY WHILE ON INSULIN AND EVERY DAY WHILE OFF INSULIN   sertraline 100 MG tablet Commonly known as:  ZOLOFT Take 2 tablets (200 mg total) by mouth daily.       Vitals:   06/08/16 0739 06/08/16 1100  BP: 104/79   Pulse: 84   Resp: 18   Temp: 98.2 F (36.8 C) 98 F (36.7 C)    Skin clean, dry and intact without evidence of skin break down, no evidence of skin tears noted. IV catheter discontinued intact. Site without signs and symptoms of complications. Dressing and pressure applied. Pt denies  pain at this time. No complaints noted.  An After Visit Summary was printed and given to the patient. Patient escorted via [place], and D/C home via private auto.  Breanna Rogers

## 2016-06-08 NOTE — Plan of Care (Signed)
Problem: Pain Managment: Goal: General experience of comfort will improve Outcome: Not Progressing Patient had fever with chills during the night.

## 2016-06-08 NOTE — Progress Notes (Signed)
Pancreatitis; may have been jardiance, but will also stop [place] for now

## 2016-06-09 ENCOUNTER — Other Ambulatory Visit: Payer: Self-pay | Admitting: Family Medicine

## 2016-06-09 LAB — URINE CULTURE

## 2016-06-09 NOTE — Telephone Encounter (Signed)
Recent hospitalization Last ALT and lipids reviewed; Rx approved

## 2016-06-12 LAB — CULTURE, BLOOD (ROUTINE X 2)
CULTURE: NO GROWTH
Culture: NO GROWTH

## 2016-06-21 ENCOUNTER — Ambulatory Visit (INDEPENDENT_AMBULATORY_CARE_PROVIDER_SITE_OTHER): Payer: Managed Care, Other (non HMO) | Admitting: Family Medicine

## 2016-06-21 ENCOUNTER — Encounter: Payer: Self-pay | Admitting: Family Medicine

## 2016-06-21 DIAGNOSIS — Z6830 Body mass index (BMI) 30.0-30.9, adult: Secondary | ICD-10-CM | POA: Diagnosis not present

## 2016-06-21 DIAGNOSIS — E11319 Type 2 diabetes mellitus with unspecified diabetic retinopathy without macular edema: Secondary | ICD-10-CM | POA: Diagnosis not present

## 2016-06-21 DIAGNOSIS — K858 Other acute pancreatitis without necrosis or infection: Secondary | ICD-10-CM | POA: Diagnosis not present

## 2016-06-21 DIAGNOSIS — R809 Proteinuria, unspecified: Secondary | ICD-10-CM | POA: Diagnosis not present

## 2016-06-21 DIAGNOSIS — E6609 Other obesity due to excess calories: Secondary | ICD-10-CM | POA: Diagnosis not present

## 2016-06-21 DIAGNOSIS — E113299 Type 2 diabetes mellitus with mild nonproliferative diabetic retinopathy without macular edema, unspecified eye: Secondary | ICD-10-CM

## 2016-06-21 NOTE — Assessment & Plan Note (Signed)
Resolved; discussed risk of pancreatitis with some diabetes meds and she agrees to stay off of meds and instead will work hard on weight loss for control of her diabetes

## 2016-06-21 NOTE — Progress Notes (Signed)
BP 120/70   Pulse 84   Temp 98.2 F (36.8 C) (Oral)   Resp 16   Wt 212 lb 1.6 oz (96.2 kg)   SpO2 97%   BMI 30.43 kg/m    Subjective:    Patient ID: Breanna Rogers, female    DOB: [birth year], 51 y.o.   MRN: [id number]  HPI: Breanna Rogers is a 51 y.o. female  Chief Complaint  Patient presents with  . Hospitalization Follow-up    acute pancritis   Patient is here for hospital f/u after having pancreatitis Admitted to [facility] on June 06, 2016 Discharged on June 08, 2016 Has been doing well since Two of her diabetes medicines have been stopped and her sugars are running in the low 200s Stools are normal No more abdominal pain No hx of pancreatitis in the family Daughter just had her gallbladder taken out a few months ago, not diabetic; bad acid reflux High cholesterol reviewed; she has brought her TG down about 80 points and she's brought her LDL down >70 points  Depression screen [facility] 2/9 06/21/2016 06/06/2016 04/04/2016 09/21/2015 07/08/2015  Decreased Interest 0 0 0 0 0  Down, Depressed, Hopeless 0 0 0 0 0  PHQ - 2 Score 0 0 0 0 0    No flowsheet data found.  Relevant past medical, surgical, family and social history reviewed Past Medical History:  Diagnosis Date  . Diabetes mellitus type 2 with retinopathy ([place])   . Elevated transaminase level   . Hot flashes   . Hyperlipidemia   . 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 Numbness of foot   . Obesity   . Pancreatitis   . Spasm of muscle   . Vitamin D deficiency    Past Surgical History:  Procedure Laterality Date  . CERVICAL FUSION  2013   C5-7 at [facility], (ACDF C5-6 with removal of hardware)  . TONSILLECTOMY AND ADENOIDECTOMY  1976  . TUBAL LIGATION  1990   Family History  Problem Relation Age of Onset  . Cancer Mother     cervical  . Asthma Mother   . Diabetes Mother   . Heart disease Mother   . Hyperlipidemia Mother   . Heart disease Father   .  Hyperlipidemia Father   . Hypertension Father   . Heart attack Father   . Thyroid disease Sister   . Cancer Sister   . Diabetes Brother   . Hypertension Brother   . Diabetes Sister   . Heart attack Paternal Grandfather   . Diabetes Sister   . Heart disease Sister   . Heart attack Sister   . COPD Sister   . Stroke Neg Hx    Social History  Substance Use Topics  . Smoking status: Former Smoker    Packs/day: 1.00    Years: 35.00    Types: Cigarettes    Quit date: 03/28/2010  . Smokeless tobacco: Never Used  . Alcohol use No   Interim medical history since last visit reviewed. Allergies and medications reviewed  Review of Systems Per HPI unless specifically indicated above     Objective:    BP 120/70   Pulse 84   Temp 98.2 F (36.8 C) (Oral)   Resp 16   Wt 212 lb 1.6 oz (96.2 kg)   SpO2 97%   BMI 30.43 kg/m   Wt Readings from Last 3 Encounters:  06/21/16 212 lb 1.6 oz (96.2 kg)  06/08/16 215 lb (97.5 kg)  06/06/16 209 lb 2 oz (94.9 kg)    Physical Exam  Constitutional: She appears well-developed and well-nourished. No distress.  obese  HENT:  Head: Normocephalic and atraumatic.  Eyes: EOM are normal. No scleral icterus.  Neck: No thyromegaly present.  Cardiovascular: Normal rate, regular rhythm and normal heart sounds.   No murmur heard. Pulmonary/Chest: Effort normal and breath sounds normal. No respiratory distress. She has no wheezes.  Abdominal: Soft. She exhibits no distension. There is no tenderness. There is no guarding.  Musculoskeletal: Normal range of motion. She exhibits no edema.  Neurological: She is alert.  Skin: Skin is warm and dry. She is not diaphoretic. No pallor.  Psychiatric: She has a normal mood and affect. Her behavior is normal. Judgment and thought content normal.   Diabetic Foot Form - Detailed   Diabetic Foot Exam - detailed Diabetic Foot exam was performed with the following findings:  Yes 06/21/2016 10:41 AM  Visual Foot Exam  completed.:  Yes  Are the toenails ingrown?:  No Normal Range of Motion:  Yes Pulse Foot Exam completed.:  Yes  Right Dorsalis Pedis:  Present Left Dorsalis Pedis:  Present  Sensory Foot Exam Completed.:  Yes Semmes-Weinstein Monofilament Test R Site 1-Great Toe:  Pos L Site 1-Great Toe:  Pos  R Site 4:  Pos L Site 4:  Pos  R Site 5:  Pos L Site 5:  Pos           Assessment & Plan:   Problem List Items Addressed This Visit      Digestive   Acute pancreatitis    Resolved; discussed risk of pancreatitis with some diabetes meds and she agrees to stay off of meds and instead will work hard on weight loss for control of her diabetes        Endocrine   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[place])    Keep appt with eye doctor next week      Diabetes mellitus type 2 with retinopathy ([place])    Patient will work on weight loss; we calculated what weight would get her BMI to 27, which will be 188 pounds; she would prefer to do this than add more medicines; she'll check her FSBS and let me know if trending up so we can consider something in the meantime Will have her come in for a POCT hemoglobin A1c on or after April 9th        Other   Urine test positive for microalbuminuria    Continue ACE-I for protection      Obesity    We reviewed BMI readings; she is currently over 30; it will take a weight of 188 pounds to get her down to a BMI of 27          Follow up plan: Return in about 3 months (around 10/03/2016) for fasting labs and visit with Dr. Sanda Klein.  An after-visit summary was printed and given to the patient at [place].  Please see the patient instructions which may contain other information and recommendations beyond what is mentioned above in the assessment and plan.  Meds ordered this encounter  Medications  . b complex vitamins tablet    Sig: Take 1 tablet by mouth daily.  . Multiple Vitamin tablet    Sig: Take 1 tablet by mouth daily.

## 2016-06-21 NOTE — Assessment & Plan Note (Signed)
We reviewed BMI readings; she is currently over 30; it will take a weight of 188 pounds to get her down to a BMI of 27

## 2016-06-21 NOTE — Assessment & Plan Note (Signed)
Keep appt with eye doctor next week

## 2016-06-21 NOTE — Assessment & Plan Note (Addendum)
Patient will work on weight loss; we calculated what weight would get her BMI to 27, which will be 188 pounds; she would prefer to do this than add more medicines; she'll check her FSBS and let me know if trending up so we can consider something in the meantime Will have her come in for a POCT hemoglobin A1c on or after April 9th

## 2016-06-21 NOTE — Assessment & Plan Note (Signed)
Continue ACE-I for protection

## 2016-06-21 NOTE — Patient Instructions (Addendum)
You have my blessing to take 250 mg or 400 mg of magnesium a day  Check out the information at familydoctor.org entitled "Nutrition for Weight Loss: What You Need to Know about Fad Diets" Try to lose between 1-2 pounds per week by taking in fewer calories and burning off more calories You can succeed by limiting portions, limiting foods dense in calories and fat, becoming more active, and drinking 8 glasses of water a day (64 ounces) Don't skip meals, especially breakfast, as skipping meals may alter your metabolism Do not use over-the-counter weight loss pills or gimmicks that claim rapid weight loss A healthy BMI (or body mass index) is between 18.5 and 24.9 You can calculate your ideal BMI at the [place] website ClubMonetize.fr  Goal weight over next 3-6 months is 188 pounds  Monitor your sugars and contact me if we need to add a little something in the meantime  Return for just a fingerstick A1c on or after April 8th

## 2016-10-10 ENCOUNTER — Ambulatory Visit: Payer: Managed Care, Other (non HMO) | Admitting: Family Medicine

## 2016-10-28 ENCOUNTER — Encounter: Payer: Self-pay | Admitting: Family Medicine

## 2016-10-28 ENCOUNTER — Ambulatory Visit (INDEPENDENT_AMBULATORY_CARE_PROVIDER_SITE_OTHER): Payer: Self-pay | Admitting: Family Medicine

## 2016-10-28 VITALS — BP 120/74 | HR 88 | Temp 98.0°F | Resp 16 | Ht 70.0 in | Wt 212.6 lb

## 2016-10-28 DIAGNOSIS — E785 Hyperlipidemia, unspecified: Secondary | ICD-10-CM

## 2016-10-28 DIAGNOSIS — E6609 Other obesity due to excess calories: Secondary | ICD-10-CM

## 2016-10-28 DIAGNOSIS — E11319 Type 2 diabetes mellitus with unspecified diabetic retinopathy without macular edema: Secondary | ICD-10-CM

## 2016-10-28 DIAGNOSIS — Z5181 Encounter for therapeutic drug level monitoring: Secondary | ICD-10-CM

## 2016-10-28 DIAGNOSIS — Z6830 Body mass index (BMI) 30.0-30.9, adult: Secondary | ICD-10-CM

## 2016-10-28 LAB — POCT GLYCOSYLATED HEMOGLOBIN (HGB A1C): HEMOGLOBIN A1C: 7.5

## 2016-10-28 MED ORDER — LOVASTATIN 20 MG PO TABS: 20.0000 mg | ORAL_TABLET | Freq: Every day | ORAL | 1 refills | Status: DC

## 2016-10-28 MED ORDER — CETIRIZINE HCL 10 MG PO TABS: 10.0000 mg | ORAL_TABLET | Freq: Every day | ORAL | 3 refills | Status: DC | PRN

## 2016-10-28 MED ORDER — LISINOPRIL 5 MG PO TABS: 5.0000 mg | ORAL_TABLET | Freq: Every day | ORAL | 1 refills | Status: DC

## 2016-10-28 MED ORDER — METFORMIN HCL ER 500 MG PO TB24: 2000.0000 mg | ORAL_TABLET | Freq: Every day | ORAL | 1 refills | Status: DC

## 2016-10-28 NOTE — Progress Notes (Signed)
BP 120/74   Pulse 88   Temp 98 F (36.7 C) (Oral)   Resp 16   Ht 5\' 10"  (1.778 m)   Wt 212 lb 9.6 oz (96.4 kg)   SpO2 98%   BMI 30.50 kg/m    Subjective:    Patient ID: Breanna Rogers, female    DOB: December 05, 1965, 51 y.o.   MRN: [id number]  HPI: Breanna Rogers is a 51 y.o. female  Chief Complaint  Patient presents with  . Diabetes    3 month f/U  . Weight Loss    3 month f/U  . Hyperlipidemia    3 month F/U  . Hypertension    3 month F/U    HPI She is here for f/u; type 2 dm Fasting blood sugar this am was 192 Yesterday 160 range No lows No problems with feet Vision is "funky" anyway Not much dry mouth at all Lab Results  Component Value Date   HGBA1C 7.5 10/28/2016   Not taking sertraline any more; not taking because she doesn't feel like she needs it any more; weaned off on her own gradually  She is working on weight loss, was doing really well; just watching what she was eating; just being "good"; a whole month; she loves birthday cake and there was a birthday every week for a whole month; changes in life, hard to get back on track; not drinking enough   High cholesterol; would like to switch medicines today for cost; she does not have any insurance; would prefer to not do labs too  Her chest has been feeling funky; the air is very dirty; "it's not like I'm having a heart attack"; it feels like when I get pneumonia; tickle in the throat; coughing, but not productive; just scratchy throat, tickle itch; having chest pressure; I discussed risk of heart attack and she said "it's nothing like that"air filters are black at work; every day thing, Monday through Friday; ongoing constant chest symptoms, worse at work than at home; not 20-30 minutes, it's an all day thing; going on for a month or 6 weeks, as long as she has been out on the floor; 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 Diabetes mellitus type 2 with retinopathy ([place])   . Elevated transaminase level   . Hot flashes   . Hyperlipidemia   . 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 Numbness of foot   . Obesity   . Pancreatitis   . Spasm of muscle   . Vitamin D deficiency    Past Surgical History:  Procedure Laterality Date  . CERVICAL FUSION  2013   C5[facility]7 at [facility], (ACDF C5-6 with removal of hardware)  . TONSILLECTOMY AND ADENOIDECTOMY  1976  . TUBAL LIGATION  1990   Family History  Problem Relation Age of Onset  . Cancer Mother        cervical  . Asthma Mother   . Diabetes Mother   . Heart disease Mother   . Hyperlipidemia Mother   . Heart disease Father   .  Hyperlipidemia Father   . Hypertension Father   . Heart attack Father   . Thyroid disease Sister   . Cancer Sister   . Diabetes Brother   . Hypertension Brother   . Diabetes Sister   . Heart attack Paternal Grandfather   . Diabetes Sister   . Heart disease Sister   . Heart attack Sister   . COPD Sister   . Stroke Neg Hx    Social History   Social History  . Marital status: Married    Spouse name: N/A  . Number of children: N/A  . Years of education: N/A   Occupational History  . Not on file.   Social History Main Topics  . Smoking status: Former Smoker    Packs/day: 1.00    Years: 35.00    Types: Cigarettes    Quit date: 03/28/2010  . Smokeless tobacco: Never Used  . Alcohol use No  . Drug use: No  . Sexual activity: Yes   Other Topics Concern  . Not on file   Social History Narrative  . No narrative on file    Interim medical history since last visit reviewed. Allergies and medications reviewed  Review of Systems Per  HPI unless specifically indicated above     Objective:    BP 120/74   Pulse 88   Temp 98 F (36.7 C) (Oral)   Resp 16   Ht 5\' 10"  (1.778 m)   Wt 212 lb 9.6 oz (96.4 kg)   SpO2 98%   BMI 30.50 kg/m   Wt Readings from Last 3 Encounters:  10/28/16 212 lb 9.6 oz (96.4 kg)  06/21/16 212 lb 1.6 oz (96.2 kg)  06/08/16 215 lb (97.5 kg)    Physical Exam  Constitutional: She appears well-developed and well-nourished. No distress.  obese  HENT:  Head: Normocephalic and atraumatic.  Eyes: EOM are normal. No scleral icterus.  Neck: No thyromegaly present.  Cardiovascular: Normal rate, regular rhythm and normal heart sounds.   No murmur heard. Pulmonary/Chest: Effort normal and breath sounds normal. No respiratory distress. She has no wheezes.  Abdominal: Soft. She exhibits no distension.  Musculoskeletal: Normal range of motion. She exhibits no edema.  Neurological: She is alert.  Skin: Skin is warm and dry. She is not diaphoretic. No pallor.  Psychiatric: She has a normal mood and affect. Her behavior is normal. Judgment and thought content normal.   Diabetic Foot Form - Detailed   Diabetic Foot Exam - detailed Diabetic Foot exam was performed with the following findings:  Yes 10/28/2016 10:16 AM  Visual Foot Exam completed.:  Yes  Pulse Foot Exam completed.:  Yes  Right Dorsalis Pedis:  Present Left Dorsalis Pedis:  Present  Sensory Foot Exam Completed.:  Yes Semmes-Weinstein Monofilament Test R Site 1-Great Toe:  Pos L Site 1-Great Toe:  Pos           Assessment & Plan:   Problem List Items Addressed This Visit      Endocrine   Diabetes mellitus type 2 with retinopathy ([place])    A1c here, encouraged healthy lifestyle habits; foot exam by MD; hx of pancreatitis so will avoid certain medicines, or at least be very watchful/careful in future if they must be considered      Relevant Medications   metFORMIN (GLUCOPHAGE-XR) 500 MG 24 hr tablet   lovastatin (MEVACOR) 20 MG  tablet   lisinopril (PRINIVIL,ZESTRIL) 5 MG tablet   Other Relevant Orders   POCT HgB A1C (  Completed)     Other   Obesity - Primary    Encouragement given for weight loss      Relevant Medications   metFORMIN (GLUCOPHAGE-XR) 500 MG 24 hr tablet   Medication monitoring encounter    Patient does not have insurance and asked to skip labs today if possible; she'll see if she can apply for [facility]      Dyslipidemia    Patient does not have insurance; discussed [facility] option with her, and she asked to not draw lipids today; goal TG less than 150, goal HDL over 50, goal LDL less than 100 at least; healthy eating encouraged      Relevant Medications   lovastatin (MEVACOR) 20 MG tablet       Follow up plan: No Follow-up on file.  An after-visit summary was printed and given to the patient at [place].  Please see the patient instructions which may contain other information and recommendations beyond what is mentioned above in the assessment and plan.  Meds ordered this encounter  Medications  . Magnesium 250 MG TABS    Sig: Take 250 mg by mouth daily. Take two caps daily  . metFORMIN (GLUCOPHAGE-XR) 500 MG 24 hr tablet    Sig: Take 4 tablets (2,000 mg total) by mouth daily with breakfast.    Dispense:  360 tablet    Refill:  1  . DISCONTD: lisinopril (PRINIVIL,ZESTRIL) 5 MG tablet    Sig: Take 1 tablet (5 mg total) by mouth daily.    Dispense:  90 tablet    Refill:  1  . DISCONTD: lovastatin (MEVACOR) 20 MG tablet    Sig: Take 1 tablet (20 mg total) by mouth at bedtime.    Dispense:  90 tablet    Refill:  1  . cetirizine (ZYRTEC) 10 MG tablet    Sig: Take 1 tablet (10 mg total) by mouth daily as needed.    Dispense:  90 tablet    Refill:  3  . lovastatin (MEVACOR) 20 MG tablet    Sig: Take 1 tablet (20 mg total) by mouth at bedtime.    Dispense:  90 tablet    Refill:  1  . lisinopril (PRINIVIL,ZESTRIL) 5 MG tablet    Sig: Take 1 tablet (5 mg total) by  mouth daily.    Dispense:  90 tablet    Refill:  1    Orders Placed This Encounter  Procedures  . POCT HgB A1C

## 2016-10-28 NOTE — Patient Instructions (Addendum)
[facility] Phone: [phone]  Check out the information at familydoctor.org entitled "Nutrition for Weight Loss: What You Need to Know about Fad Diets" Try to lose between 1-2 pounds per week by taking in fewer calories and burning off more calories You can succeed by limiting portions, limiting foods dense in calories and fat, becoming more active, and drinking 8 glasses of water a day (64 ounces) Don't skip meals, especially breakfast, as skipping meals may alter your metabolism Do not use over-the-counter weight loss pills or gimmicks that claim rapid weight loss A healthy BMI (or body mass index) is between 18.5 and 24.9 You can calculate your ideal BMI at the [place] website ClubMonetize.fr Get in to see provider at [facility] soon for other labs Call me if you change your mind about getting an EKG or cardiology work-up or chest xray Work on 10 pounds of weight loss to get that A1c down under 7

## 2016-10-28 NOTE — Assessment & Plan Note (Addendum)
A1c here, encouraged healthy lifestyle habits; foot exam by MD; hx of pancreatitis so will avoid certain medicines, or at least be very watchful/careful in future if they must be considered

## 2016-11-03 NOTE — Assessment & Plan Note (Signed)
Encouragement given for weight loss 

## 2016-11-03 NOTE — Assessment & Plan Note (Signed)
Patient does not have insurance and asked to skip labs today if possible; she'll see if she can apply for [facility]

## 2016-11-03 NOTE — Assessment & Plan Note (Signed)
Patient does not have insurance; discussed [facility] option with her, and she asked to not draw lipids today; goal TG less than 150, goal HDL over 50, goal LDL less than 100 at least; healthy eating encouraged

## 2017-01-31 ENCOUNTER — Ambulatory Visit: Payer: Self-pay | Admitting: Family Medicine

## 2017-02-22 ENCOUNTER — Ambulatory Visit: Payer: Self-pay | Admitting: *Deleted

## 2017-02-22 NOTE — Telephone Encounter (Signed)
Pt has had diarrhea since Friday, loose at first now watery. She is starting to feel a little weak. Feels like every time she drinks something it goes straight thru. She has been to the bathroom several times at work but had to leave because she could not get there quick enough. Care advice given to patient with verbal understanding.  Reason for Disposition . SEVERE diarrhea (e.g., 7 or more times / day more than normal)  Answer Assessment - Initial Assessment Questions 1. DIARRHEA SEVERITY: "How bad is the diarrhea?" "How many extra stools have you had in the past 24 hours than normal?"    - MILD: Few loose or mushy BMs; increase of 1-3 stools over normal daily number of stools; mild increase in ostomy output.   - MODERATE: Increase of 4-6 stools daily over normal; moderate increase in ostomy output.   - SEVERE (or Worst Possible): Increase of 7 or more stools daily over normal; moderate increase in ostomy output; incontinence.     severe 2. ONSET: "When did the diarrhea begin?"      Last friday 3. BM CONSISTENCY: "How loose or watery is the diarrhea?"      Started out as loose but now watery 4. VOMITING: "Are you also vomiting?" If so, ask: "How many times in the past 24 hours?"      No 5. ABDOMINAL PAIN: "Are you having any abdominal pain?" If yes: "What does it feel like?" (e.g., crampy, dull, intermittent, constant)      Cramping, intermittent just whenever she needs to go to the bathroom 6. ABDOMINAL PAIN SEVERITY: If present, ask: "How bad is the pain?"  (e.g., Scale 1-10; mild, moderate, or severe)    - MILD (1-3): doesn't interfere with normal activities, abdomen soft and not tender to touch     - MODERATE (4-7): interferes with normal activities or awakens from sleep, tender to touch     - SEVERE (8-10): excruciating pain, doubled over, unable to do any normal activities       2 7. ORAL INTAKE: If vomiting, "Have you been able to drink liquids?" "How much fluids have you had in the  past 24 hours?"     Yes but goes straight thru. Has had 6 or 8 cups of water 8. HYDRATION: "Any signs of dehydration?" (e.g., dry mouth [not just dry lips], too weak to stand, dizziness, new weight loss) "When did you last urinate?"     Yes, weak and lightheaded. Urinated less than an hour ago. Very light yellow in color. 9. EXPOSURE: "Have you traveled to a foreign country recently?" "Have you been exposed to anyone with diarrhea?" "Could you have eaten any food that was spoiled?"     no 10. OTHER SYMPTOMS: "Do you have any other symptoms?" (e.g., fever, blood in stool)       No fever, no blood 11. PREGNANCY: "Is there any chance you are pregnant?" "When was your last menstrual period?"       no  Protocols used: DIARRHEA-A-AH

## 2017-04-26 ENCOUNTER — Ambulatory Visit: Payer: Self-pay | Admitting: Family Medicine

## 2017-04-28 ENCOUNTER — Other Ambulatory Visit: Payer: Self-pay | Admitting: Family Medicine

## 2017-04-28 ENCOUNTER — Ambulatory Visit
Admission: RE | Admit: 2017-04-28 | Discharge: 2017-04-28 | Disposition: A | Discharge status: Home / Self Care | Payer: 59 | Source: Ambulatory Visit | Attending: Family Medicine | Admitting: Family Medicine

## 2017-04-28 ENCOUNTER — Telehealth: Payer: Self-pay | Admitting: Family Medicine

## 2017-04-28 ENCOUNTER — Telehealth: Payer: Self-pay

## 2017-04-28 ENCOUNTER — Ambulatory Visit (INDEPENDENT_AMBULATORY_CARE_PROVIDER_SITE_OTHER): Payer: 59 | Admitting: Family Medicine

## 2017-04-28 ENCOUNTER — Encounter: Payer: Self-pay | Admitting: Family Medicine

## 2017-04-28 VITALS — BP 124/82 | HR 100 | Temp 98.5°F | Resp 14 | Wt 224.1 lb

## 2017-04-28 DIAGNOSIS — R9431 Abnormal electrocardiogram [ECG] [EKG]: Secondary | ICD-10-CM

## 2017-04-28 DIAGNOSIS — R0602 Shortness of breath: Secondary | ICD-10-CM

## 2017-04-28 DIAGNOSIS — E785 Hyperlipidemia, unspecified: Secondary | ICD-10-CM | POA: Diagnosis not present

## 2017-04-28 DIAGNOSIS — Z862 Personal history of diseases of the blood and blood-forming organs and certain disorders involving the immune mechanism: Secondary | ICD-10-CM

## 2017-04-28 DIAGNOSIS — R809 Proteinuria, unspecified: Secondary | ICD-10-CM

## 2017-04-28 DIAGNOSIS — E113299 Type 2 diabetes mellitus with mild nonproliferative diabetic retinopathy without macular edema, unspecified eye: Secondary | ICD-10-CM

## 2017-04-28 DIAGNOSIS — Z87891 Personal history of nicotine dependence: Secondary | ICD-10-CM | POA: Insufficient documentation

## 2017-04-28 DIAGNOSIS — R635 Abnormal weight gain: Secondary | ICD-10-CM | POA: Diagnosis not present

## 2017-04-28 DIAGNOSIS — Z8249 Family history of ischemic heart disease and other diseases of the circulatory system: Secondary | ICD-10-CM | POA: Diagnosis not present

## 2017-04-28 DIAGNOSIS — Z5181 Encounter for therapeutic drug level monitoring: Secondary | ICD-10-CM

## 2017-04-28 DIAGNOSIS — Z8719 Personal history of other diseases of the digestive system: Secondary | ICD-10-CM

## 2017-04-28 DIAGNOSIS — Z6830 Body mass index (BMI) 30.0-30.9, adult: Secondary | ICD-10-CM

## 2017-04-28 DIAGNOSIS — E6609 Other obesity due to excess calories: Secondary | ICD-10-CM | POA: Diagnosis not present

## 2017-04-28 DIAGNOSIS — E11319 Type 2 diabetes mellitus with unspecified diabetic retinopathy without macular edema: Secondary | ICD-10-CM | POA: Diagnosis not present

## 2017-04-28 DIAGNOSIS — F320 Major depressive disorder, single episode, mild: Secondary | ICD-10-CM

## 2017-04-28 LAB — TROPONIN I: Troponin I: <0.01 ng/mL (ref <=0.0)

## 2017-04-28 LAB — D-DIMER, QUANTITATIVE: D-Dimer, Quant: <0.19 mcg/mL FEU (ref <0.50)

## 2017-04-28 MED ORDER — ATORVASTATIN CALCIUM 20 MG PO TABS: 20.0000 mg | ORAL_TABLET | Freq: Every day | ORAL | 1 refills | Status: DC

## 2017-04-28 MED ORDER — SERTRALINE HCL 50 MG PO TABS: ORAL_TABLET | ORAL | 0 refills | Status: DC

## 2017-04-28 MED ORDER — DIAZEPAM 10 MG PO TABS: 5.0000 mg | ORAL_TABLET | Freq: Three times a day (TID) | ORAL | Status: DC | PRN

## 2017-04-28 NOTE — Assessment & Plan Note (Signed)
Yearly eye exams; check A1c today

## 2017-04-28 NOTE — Assessment & Plan Note (Signed)
Check urine today 

## 2017-04-28 NOTE — Assessment & Plan Note (Signed)
Check liver and kidneys 

## 2017-04-28 NOTE — Telephone Encounter (Signed)
Copied from [place]. Topic: Quick Communication - Other Results >> Apr 28, 2017 12:31 PM Boyd Kerbs wrote: Lovey Newcomer from Dr. Kristeen Miss office.  They are seeing the patient this afternoon but can not pull up in Epic   asking to please Fax to # [phone]  The requested notes has been sent.

## 2017-04-28 NOTE — Progress Notes (Signed)
BP 124/82   Pulse 100   Temp 98.5 F (36.9 C) (Oral)   Resp 14   Wt 224 lb 1.6 oz (101.7 kg)   SpO2 99%   BMI 32.16 kg/m    Subjective:    Patient ID: Breanna Rogers, female    DOB: 02-Feb-1966, 52 y.o.   MRN: [id number]  HPI: Breanna Rogers is a 52 y.o. female  Chief Complaint  Patient presents with  . Follow-up    HPI Patient is here for regular follow-up, but has a new problem For about a month, timess when she feels like cannot get a good deep breath No fam hx of lung disease No wheezing No cough Used to smoke; quit 8+ years ago (the 2nd time), stopped in 2011 or so Smoked 1 ppd over a total of 30 years Happens sitting or walking, any time No chest pressure or chest tightness No nausea Both parents had heart disease; sister had heart disease Had EKG last year when she had pancreatitis, abnormal, reviewed No long car trips or airline flights; no pain or redness in the calves  Would like to go back on sertraline; hot flashes, short-fused Mother's birthday was yesterday  She has type 2 diabetes Lab Results  Component Value Date   HGBA1C 7.5 10/28/2016  urine microalb:Cr has been abnormal in the past She had mild nonproliferative diab retinopathy  She has high cholesterol Lab Results  Component Value Date   CHOL 164 06/06/2016   HDL 59 06/06/2016   LDLCALC 68 06/06/2016   TRIG 187 (H) 06/06/2016   CHOLHDL 2.8 06/06/2016    Depression screen PHQ 2/9 04/28/2017 10/28/2016 06/21/2016 06/06/2016 04/04/2016  Decreased Interest 0 0 0 0 0  Down, Depressed, Hopeless 1 0 0 0 0  PHQ - 2 Score 1 0 0 0 0    Relevant past medical, surgical, family and social history reviewed Past Medical History:  Diagnosis Date  . Diabetes mellitus type 2 with retinopathy ([place])   . Elevated transaminase level   . Hot flashes   . Hyperlipidemia   . 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 Numbness of foot   . Obesity   . Pancreatitis     . Spasm of muscle   . Vitamin D deficiency    Past Surgical History:  Procedure Laterality Date  . CERVICAL FUSION  2013   C5-7 at [facility], (ACDF C5-6 with removal of hardware)  . TONSILLECTOMY AND ADENOIDECTOMY  1976  . TUBAL LIGATION  1990   Family History  Problem Relation Age of Onset  . Cancer Mother        cervical  . Asthma Mother   . Diabetes Mother   . Heart disease Mother   . Hyperlipidemia Mother   . Heart disease Father   . Hyperlipidemia Father   . Hypertension Father   . Heart attack Father   . Thyroid disease Sister   . Cancer Sister   . Diabetes Brother   . Hypertension Brother   . Diabetes Sister   . Heart attack Paternal Grandfather   . Diabetes Sister   . Heart disease Sister   . Heart attack Sister   . COPD Sister   . Stroke Neg Hx    Social History   Tobacco Use  . Smoking status: Former Smoker    Packs/day: 1.00    Years: 35.00    Pack years: 35.00    Types:  Cigarettes    Last attempt to quit: 03/28/2010    Years since quitting: 7.0  . Smokeless tobacco: Never Used  Substance Use Topics  . Alcohol use: No  . Drug use: No    Interim medical history since last visit reviewed. Allergies and medications reviewed  Review of Systems Per HPI unless specifically indicated above     Objective:    BP 124/82   Pulse 100   Temp 98.5 F (36.9 C) (Oral)   Resp 14   Wt 224 lb 1.6 oz (101.7 kg)   SpO2 99%   BMI 32.16 kg/m   Wt Readings from Last 3 Encounters:  04/28/17 224 lb 1.6 oz (101.7 kg)  10/28/16 212 lb 9.6 oz (96.4 kg)  06/21/16 212 lb 1.6 oz (96.2 kg)    Physical Exam  Constitutional: She appears well-developed and well-nourished. No distress.  Obese, weight gain 12 pounds over last 6 months  HENT:  Head: Normocephalic and atraumatic.  Eyes: EOM are normal. No scleral icterus.  Neck: No thyromegaly present.  Cardiovascular: Regular rhythm and normal heart sounds.  No extrasystoles are present. Tachycardia present.  No murmur  heard. Borderline tachycardia, rate just at 100  Pulmonary/Chest: Effort normal and breath sounds normal. No respiratory distress. She has no decreased breath sounds. She has no wheezes. She has no rhonchi.  Abdominal: Soft. She exhibits no distension.  Musculoskeletal: Normal range of motion. She exhibits no edema.  No calf tenderness  Neurological: She is alert.  Monofilament testing slightly reduced on the plantar surface of the toes and distal feet  Skin: Skin is warm and dry. She is not diaphoretic. No pallor.  No flushing  Psychiatric: She has a normal mood and affect. Her behavior is normal. Judgment and thought content normal.   Diabetic Foot Form - Detailed   Diabetic Foot Exam - detailed Diabetic Foot exam was performed with the following findings:  Yes 04/28/2017 11:32 AM  Visual Foot Exam completed.:  Yes  Pulse Foot Exam completed.:  Yes  Right Dorsalis Pedis:  Present Left Dorsalis Pedis:  Present  Sensory Foot Exam Completed.:  Yes Semmes-Weinstein Monofilament Test R Site 1-Great Toe:  Pos L Site 1-Great Toe:  Pos       Results for orders placed or performed in visit on 10/28/16  POCT HgB A1C  Result Value Ref Range   Hemoglobin A1C 7.5       Assessment & Plan:   Problem List Items Addressed This Visit      Endocrine   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[place])    Yearly eye exams; check A1c today      Diabetes mellitus type 2 with retinopathy ([place])    Foot exam by MD today; check A1c; eye exams yearly      Relevant Orders   Ambulatory referral to Cardiology   EKG 12-Lead   Microalbumin / creatinine urine ratio   Lipid panel   Hemoglobin A1c   COMPLETE METABOLIC PANEL WITH GFR     Other   Urine test positive for microalbuminuria    Check urine today      Relevant Orders   Microalbumin / creatinine urine ratio   Obesity    Encouraged weight loss      Relevant Orders   Ambulatory referral to Cardiology   Mild major depression ([place])     Start back on the SSRI; close f/u; no HI/SI      Relevant Medications   diazepam (VALIUM) 10 MG  tablet   sertraline (ZOLOFT) 50 MG tablet   Medication monitoring encounter    Check liver and kidneys      Relevant Orders   COMPLETE METABOLIC PANEL WITH GFR   Hx of iron deficiency anemia    Last several H/H readings reviewed have been normal; however, with her [facility], will check to r/o anemia      Hx of acute pancreatitis    No current symptoms; will be mindful of medicines in the future, watch TG      Dyslipidemia    Check lipids; goal LDL less than 100 at least, less than 70 will be ideal; decrease saturated fats, work on weight loss      Relevant Orders   Ambulatory referral to Cardiology   Lipid panel    Other Visit Diagnoses    Shortness of breath    -  Primary   Relevant Orders   Ambulatory referral to Cardiology   DG Chest 2 View   Pulmonary Function Test [facility] Only   EKG 12-Lead   CBC   D-Dimer, Quantitative   Troponin I   Fam hx-ischem heart disease       Relevant Orders   Ambulatory referral to Cardiology   EKG 12-Lead   Hx of tobacco use, presenting hazards to health       Relevant Orders   DG Chest 2 View   Pulmonary Function Test [facility] Only   Weight gain       Relevant Orders   TSH   Abnormal EKG       Relevant Orders   Troponin I       Follow up plan: Return in about 3 weeks (around 05/19/2017) for follow-up visit with Dr. Sanda Klein.  An after-visit summary was printed and given to the patient at [facility].  Please see the patient instructions which may contain other information and recommendations beyond what is mentioned above in the assessment and plan.  Meds ordered this encounter  Medications  . diazepam (VALIUM) 10 MG tablet    Sig: Take 0.5-1 tablets (5-10 mg total) by mouth every 8 (eight) hours as needed.  . sertraline (ZOLOFT) 50 MG tablet    Sig: One by mouth daily for two weeks, then two a day    Dispense:  48 tablet    Refill:  0     Orders Placed This Encounter  Procedures  . DG Chest 2 View  . Microalbumin / creatinine urine ratio  . Lipid panel  . Hemoglobin A1c  . COMPLETE METABOLIC PANEL WITH GFR  . TSH  . CBC  . D-Dimer, Quantitative  . Troponin I  . Ambulatory referral to Cardiology  . Pulmonary Function Test [facility] Only  . EKG 12-Lead    Dr. Clayborn Bigness graciously reviewed her EKG before she left, said okay to see her at 2 pm Face-to-face time with patient was more than 40 minutes, >50% time spent counseling and coordination of care

## 2017-04-28 NOTE — Assessment & Plan Note (Signed)
Check lipids; goal LDL less than 100 at least, less than 70 will be ideal; decrease saturated fats, work on weight loss

## 2017-04-28 NOTE — Patient Instructions (Addendum)
Starting at age 52, we'll get yearly chest CTs until you have been quit for 15 years We'll get you in to see the cardiologist Please have the chest xray done (today or next week) We'll get some breathing studies on you If you have not heard anything from my staff in a week about any orders/referrals/studies from today, please contact us here to follow-up [phone] If you develop chest pain, significant shortness of breath, other problems before your tests are done or before you see the cardiologist, please go to the emergency department  Coronary Artery Disease, Female Coronary artery disease (CAD) is a condition in which the arteries that lead to the heart (coronary arteries) become narrow or blocked. The narrowing or blockage can lead to decreased blood flow to the heart. Prolonged reduced blood flow can cause a heart attack (myocardial infarction or MI). This condition may also be called coronary heart disease. Because CAD is the leading cause of death in women, it is important to understand what causes this condition and how it is treated. What are the causes? CAD is most often caused by atherosclerosis. This is the buildup of fat and cholesterol (plaque) on the inside of the arteries. Over time, the plaque may narrow or block the artery, reducing blood flow to the heart. Plaque can also become weak and break off within a coronary artery and cause a sudden blockage. Other less common causes of CAD include:  An embolism or blood clot in a coronary artery.  A tearing of the artery (spontaneous coronary artery dissection).  An aneurysm.  Inflammation (vasculitis) in the artery wall.  What increases the risk? The following factors may make you more likely to develop this condition:  Age. Women over age 25 are at a greater risk of CAD.  Family history of CAD.  High blood pressure (hypertension).  Diabetes.  High cholesterol levels.  Tobacco use.  Lack of  exercise.  Menopause. ? All postmenopausal women are at greater risk of CAD. ? Women who have experienced menopause between the ages of 62-45 (early menopause) are at a higher risk of CAD. ? Women who have experienced menopause before age 32 (premature menopause) are at a very high risk of CAD.  Excessive alcohol use  A diet high in saturated and trans fats, such as fried food and processed meat.  Other possible risk factors include:  High stress levels.  Depression  Obesity.  Sleep apnea.  What are the signs or symptoms? Many people do not have any symptoms during the early stages of CAD. As the condition progresses, symptoms may include:  Chest pain (angina). The pain can: ? Feel like crushing or squeezing, or a tightness, pressure, fullness, or heaviness in the chest. ? Last more than a few minutes or can stop and recur. The pain tends to get worse with exercise or stress and to fade with rest.  Pain in the arms, neck, jaw, or back.  Unexplained heartburn or indigestion.  Shortness of breath.  Nausea.  Sudden cold sweats.  Sudden light-headedness.  Fluttering or fast heartbeat (palpitations).  Many women have chest discomfort and the other symptoms. However, women often have unusual (atypical) symptoms, such as:  Fatigue.  Vomiting.  Unexplained feelings of nervousness or anxiety.  Unexplained weakness.  Dizziness or fainting.  How is this diagnosed? This condition is diagnosed based on:  Your family and medical history.  A physical exam.  Tests, including: ? A test to check the electrical signals in your  heart (electrocardiogram). ? Exercise stress test. This looks for signs of blockage when the heart is stressed with exercise, such as running on a treadmill. ? Pharmacologic stress test. This test looks for signs of blockage when the heart is being stressed with a medicine. ? Blood tests. ? Coronary angiogram. This is a procedure to look at the  coronary arteries to see if there is any blockage. During this test, a dye is injected into your arteries so they appear on an X-ray. ? A test that uses sound waves to take a picture of your heart (echocardiogram). ? Chest X-ray.  How is this treated? This condition may be treated by:  Healthy lifestyle changes to reduce risk factors.  Medicines such as: ? Antiplatelet medicines and blood-thinning medicines, such as aspirin. These help prevent blood clots. ? Nitroglycerin. ? Blood pressure medicines. ? Cholesterol-lowering medicine.  Coronary angioplasty and stenting. During this procedure, a thin, flexible tube is inserted through a blood vessel and into a blocked artery. A balloon or similar device on the end of the tube is inflated to open up the artery. In some cases, a small, mesh tube (stent) is inserted into the artery to keep it open.  Coronary artery bypass surgery. During this surgery, veins or arteries from other parts of the body are used to create a bypass around the blockage and allow blood to reach your heart.  Follow these instructions at home: Medicines  Take over-the-counter and prescription medicines only as told by your health care provider.  Do not take the following medicines unless your health care provider approves: ? NSAIDs, such as ibuprofen, naproxen, or celecoxib. ? Vitamin supplements that contain vitamin A, vitamin E, or both. ? Hormone replacement therapy that contains estrogen with or without progestin. Lifestyle  Follow an exercise program approved by your health care provider. Aim for 150 minutes of moderate exercise or 75 minutes of vigorous exercise each week.  Maintain a healthy weight or lose weight as approved by your health care provider.  Rest when you are tired.  Learn to manage stress or try to limit your stress. Ask your health care provider for suggestions if you need help.  Get screened for depression and seek treatment, if  needed.  Do not use any products that contain nicotine or tobacco, such as cigarettes and e-cigarettes. If you need help quitting, ask your health care provider.  Do not use illegal drugs. Eating and drinking  Follow a heart-healthy diet. A dietitian can help educate you about healthy food options and changes. In general, eat plenty of fruits and vegetables, lean meats, and whole grains.  Avoid foods high in: ? Sugar. ? Salt (sodium). ? Saturated fats, such as processed or fatty meat. ? Trans fats, such as fried food.  Use healthy cooking methods such as roasting, grilling, broiling, baking, poaching, steaming, or stir-frying.  If you drink alcohol, and your health care provider approves, limit your alcohol intake to no more than 1 drink per day. One drink equals 12 ounces of beer, 5 ounces of wine, or 1 ounces of hard liquor. General instructions  Manage any other health conditions, such as hypertension and diabetes. These conditions affect your heart.  Your health care provider may ask you to monitor your blood pressure. Ideally, your blood pressure should be below 130/80.  Keep all follow-up visits as told by your health care provider. This is important. Get help right away if:  You have pain in your chest, neck, arm, jaw,  stomach, or back that: ? Lasts more than a few minutes. ? Is recurring. ? Is not relieved by taking medicine under your tongue (sublingualnitroglycerin).  You have profuse sweating without cause.  You have unexplained: ? Heartburn or indigestion. ? Shortness of breath or difficulty breathing. ? Fluttering or fast heartbeat (palpitations). ? Nausea or vomiting. ? Fatigue. ? Feelings of nervousness or anxiety. ? Weakness. ? Diarrhea.  You have sudden light-headedness or dizziness.  You faint.  You feel like hurting yourself or think about taking your own life. These symptoms may represent a serious problem that is an emergency. Do not wait to see  if the symptoms will go away. Get medical help right away. Call your local emergency services (911 in the [place] Do not drive yourself to the hospital. Summary  Coronary artery disease (CAD) is a process in which the arteries that lead to the heart (coronary arteries) become narrow or blocked. The narrowing or blockage can lead to a heart attack.  Many women have chest discomfort and other common symptoms of CAD. However, women often have different (atypical) symptoms, such as fatigue, vomiting, and dizziness or weakness.  CAD can be treated with lifestyle changes, medicines, surgery, or a combination of these treatments. This information is not intended to replace advice given to you by your health care provider. Make sure you discuss any questions you have with your health care provider. Document Released: 06/06/2011 Document Revised: 03/04/2016 Document Reviewed: 03/04/2016 Elsevier Interactive Patient Education  2018 [employer].  Preventing Unhealthy [employer], Adult Staying at a healthy weight is important. When fat builds up in your body, you may become overweight or obese. These conditions put you at greater risk for developing certain health problems, such as heart disease, diabetes, sleeping problems, joint problems, and some cancers. Unhealthy weight gain is often the result of making unhealthy choices in what you eat. It is also a result of not getting enough exercise. You can make changes to your lifestyle to prevent obesity and stay as healthy as possible. What nutrition changes can be made? To maintain a healthy weight and prevent obesity:  Eat only as much as your body needs. To do this: ? Pay attention to signs that you are hungry or full. Stop eating as soon as you feel full. ? If you feel hungry, try drinking water first. Drink enough water so your urine is clear or pale yellow. ? Eat smaller portions. ? Look at serving sizes on food labels. Most foods contain more than  one serving per container. ? Eat the recommended amount of calories for your gender and activity level. While most active people should eat around 2,000 calories per day, if you are trying to lose weight or are not very active, you main need to eat less calories. Talk to your health care provider or dietitian about how many calories you should eat each day.  Choose healthy foods, such as: ? Fruits and vegetables. Try to fill at least half of your plate at each meal with fruits and vegetables. ? Whole grains, such as whole wheat bread, brown rice, and quinoa. ? Lean meats, such as chicken or fish. ? Other healthy proteins, such as beans, eggs, or tofu. ? Healthy fats, such as nuts, seeds, fatty fish, and olive oil. ? Low-fat or fat-free dairy.  Check food labels and avoid food and drinks that: ? Are high in calories. ? Have added sugar. ? Are high in sodium. ? Have saturated fats or trans  fats.  Limit how much you eat of the following foods: ? Prepackaged meals. ? Fast food. ? Fried foods. ? Processed meat, such as bacon, sausage, and deli meats. ? Fatty cuts of red meat and poultry with skin.  Cook foods in healthier ways, such as by baking, broiling, or grilling.  When grocery shopping, try to shop around the outside of the store. This helps you buy mostly fresh foods and avoid canned and prepackaged foods.  What lifestyle changes can be made?  Exercise at least 30 minutes 5 or more days each week. Exercising includes brisk walking, yard work, biking, running, swimming, and team sports like basketball and soccer. Ask your health care provider which exercises are safe for you.  Do not use any products that contain nicotine or tobacco, such as cigarettes and e-cigarettes. If you need help quitting, ask your health care provider.  Limit alcohol intake to no more than 1 drink a day for nonpregnant women and 2 drinks a day for men. One drink equals 12 oz of beer, 5 oz of wine, or 1 oz  of hard liquor.  Try to get 7-9 hours of sleep each night. What other changes can be made?  Keep a food and activity journal to keep track of: ? What you ate and how many calories you had. Remember to count sauces, dressings, and side dishes. ? Whether you were active, and what exercises you did. ? Your calorie, weight, and activity goals.  Check your weight regularly. Track any changes. If you notice you have gained weight, make changes to your diet or activity routine.  Avoid taking weight-loss medicines or supplements. Talk to your health care provider before starting any new medicine or supplement.  Talk to your health care provider before trying any new diet or exercise plan. Why are these changes important? Eating healthy, staying active, and having healthy habits not only help prevent obesity, they also:  Help you to manage stress and emotions.  Help you to connect with friends and family.  Improve your self-esteem.  Improve your sleep.  Prevent long-term health problems.  What can happen if changes are not made? Being obese or overweight can cause you to develop joint or bone problems, which can make it hard for you to stay active or do activities you enjoy. Being obese or overweight also puts stress on your heart and lungs and can lead to health problems like diabetes, heart disease, and some cancers. Where to find more information: Talk with your health care provider or a dietitian about healthy eating and healthy lifestyle choices. You may also find other information through these resources:  [place]. Department of Agriculture MyPlate: FormerBoss.no  American Heart Association: www.heart.org  Centers for Disease Control and Prevention: http://www.wolf.info/  Summary  Staying at a healthy weight is important. It helps prevent certain diseases and health problems, such as heart disease, diabetes, joint problems, sleep disorders, and some cancers.  Being obese or  overweight can cause you to develop joint or bone problems, which can make it hard for you to stay active or do activities you enjoy.  You can prevent unhealthy weight gain by eating a healthy diet, exercising regularly, not smoking, limiting alcohol, and getting enough sleep.  Talk with your health care provider or a dietitian for guidance about healthy eating and healthy lifestyle choices. This information is not intended to replace advice given to you by your health care provider. Make sure you discuss any questions you have with your  health care provider. Document Released: 03/15/2016 Document Revised: 04/20/2016 Document Reviewed: 04/20/2016 Elsevier Interactive Patient Education  [employer].

## 2017-04-28 NOTE — Progress Notes (Signed)
Switch statins Check labs next week to work up the high Ca2+

## 2017-04-28 NOTE — Assessment & Plan Note (Signed)
Start back on the SSRI; close f/u; no HI/SI

## 2017-04-28 NOTE — Assessment & Plan Note (Signed)
Encouraged weight loss 

## 2017-04-28 NOTE — Telephone Encounter (Signed)
[facility], Breanna Rogers-called with troponin < 0.01 collected 04/28/17 at 1216. Result read back and verified.

## 2017-04-28 NOTE — Assessment & Plan Note (Addendum)
Last several H/H readings reviewed have been normal; however, with her [facility], will check to r/o anemia

## 2017-04-28 NOTE — Assessment & Plan Note (Signed)
Foot exam by MD today; check A1c; eye exams yearly

## 2017-04-28 NOTE — Assessment & Plan Note (Signed)
No current symptoms; will be mindful of medicines in the future, watch TG

## 2017-04-28 NOTE — Telephone Encounter (Signed)
Patient notified of neg/normal trop I and D-dimer by Devonne Doughty, CMA

## 2017-04-29 LAB — LIPID PANEL
Cholesterol: 235 mg/dL — ABNORMAL HIGH (ref <200)
HDL: 59 mg/dL (ref >50)
LDL Cholesterol (Calc): 134 mg/dL (calc) — ABNORMAL HIGH
NON-HDL CHOLESTEROL (CALC): 176 mg/dL — AB (ref <130)
TRIGLYCERIDES: 275 mg/dL — AB (ref <150)
Total CHOL/HDL Ratio: 4 (calc) (ref <5.0)

## 2017-04-29 LAB — COMPLETE METABOLIC PANEL WITH GFR
AG RATIO: 1.6 (calc) (ref 1.0–2.5)
ALKALINE PHOSPHATASE (APISO): 70 U/L (ref 33–130)
ALT: 40 U/L — AB (ref 6–29)
AST: 26 U/L (ref 10–35)
Albumin: 5 g/dL (ref 3.6–5.1)
BILIRUBIN TOTAL: 0.6 mg/dL (ref 0.2–1.2)
BUN: 13 mg/dL (ref 7–25)
CHLORIDE: 99 mmol/L (ref 98–110)
CO2: 29 mmol/L (ref 20–32)
Calcium: 10.9 mg/dL — ABNORMAL HIGH (ref 8.6–10.4)
Creat: 0.67 mg/dL (ref 0.50–1.05)
GFR, Est African American: 118 mL/min/{1.73_m2} (ref >=60)
GFR, Est Non African American: 102 mL/min/{1.73_m2} (ref >=60)
Globulin: 3.1 g/dL (calc) (ref 1.9–3.7)
Glucose, Bld: 154 mg/dL — ABNORMAL HIGH (ref 65–99)
POTASSIUM: 4.5 mmol/L (ref 3.5–5.3)
Sodium: 137 mmol/L (ref 135–146)
Total Protein: 8.1 g/dL (ref 6.1–8.1)

## 2017-04-29 LAB — CBC
HCT: 41 % (ref 35.0–45.0)
Hemoglobin: 14.2 g/dL (ref 11.7–15.5)
MCH: 30.5 pg (ref 27.0–33.0)
MCHC: 34.6 g/dL (ref 32.0–36.0)
MCV: 88 fL (ref 80.0–100.0)
MPV: 9.9 fL (ref 7.5–12.5)
PLATELETS: 269 10*3/uL (ref 140–400)
RBC: 4.66 10*6/uL (ref 3.80–5.10)
RDW: 12 % (ref 11.0–15.0)
WBC: 8.5 10*3/uL (ref 3.8–10.8)

## 2017-04-29 LAB — MICROALBUMIN / CREATININE URINE RATIO
Creatinine, Urine: 46 mg/dL (ref 20–275)
MICROALB/CREAT RATIO: 22 ug/mg{creat} (ref <30)
Microalb, Ur: 1 mg/dL

## 2017-04-29 LAB — HEMOGLOBIN A1C
EAG (MMOL/L): 11.9 (calc)
HEMOGLOBIN A1C: 9.1 %{Hb} — AB (ref <5.7)
Mean Plasma Glucose: 214 (calc)

## 2017-04-29 LAB — TSH: TSH: 2.2 mIU/L

## 2017-05-01 ENCOUNTER — Other Ambulatory Visit: Payer: Self-pay | Admitting: Family Medicine

## 2017-05-01 DIAGNOSIS — E11319 Type 2 diabetes mellitus with unspecified diabetic retinopathy without macular edema: Secondary | ICD-10-CM

## 2017-05-01 MED ORDER — PEN NEEDLES 31G X 6 MM MISC: 3 refills | Status: DC

## 2017-05-01 MED ORDER — INSULIN DEGLUDEC 100 UNIT/ML ~~LOC~~ SOPN: 10.0000 [IU] | PEN_INJECTOR | Freq: Every day | SUBCUTANEOUS | 0 refills | Status: DC

## 2017-05-01 NOTE — Progress Notes (Signed)
Start back on insulin

## 2017-05-02 ENCOUNTER — Encounter: Payer: Self-pay | Admitting: Family Medicine

## 2017-05-02 DIAGNOSIS — E11319 Type 2 diabetes mellitus with unspecified diabetic retinopathy without macular edema: Secondary | ICD-10-CM

## 2017-05-02 MED ORDER — INSULIN DEGLUDEC 100 UNIT/ML ~~LOC~~ SOPN: 10.0000 [IU] | PEN_INJECTOR | Freq: Every day | SUBCUTANEOUS | 0 refills | Status: DC

## 2017-05-10 ENCOUNTER — Other Ambulatory Visit: Payer: Self-pay | Admitting: Family Medicine

## 2017-05-10 ENCOUNTER — Ambulatory Visit
Admission: RE | Admit: 2017-05-10 | Discharge: 2017-05-10 | Disposition: A | Discharge status: Home / Self Care | Payer: 59 | Source: Ambulatory Visit | Attending: Family Medicine | Admitting: Family Medicine

## 2017-05-10 DIAGNOSIS — R921 Mammographic calcification found on diagnostic imaging of breast: Secondary | ICD-10-CM | POA: Insufficient documentation

## 2017-05-10 DIAGNOSIS — Z1231 Encounter for screening mammogram for malignant neoplasm of breast: Secondary | ICD-10-CM | POA: Insufficient documentation

## 2017-05-10 DIAGNOSIS — Z1239 Encounter for other screening for malignant neoplasm of breast: Secondary | ICD-10-CM

## 2017-05-10 NOTE — Assessment & Plan Note (Signed)
Feb 2019; ordering diag mammo

## 2017-05-10 NOTE — Progress Notes (Signed)
Abnormal Ca2+ of LEFT breast Ordering diag mammo of LEFT breast per rec Mychart note to patient  Orders Placed This Encounter  Procedures  . MM Digital Diagnostic Unilat L    Standing Status:   Future    Standing Expiration Date:   07/09/2018    Order Specific Question:   Reason for Exam (SYMPTOM  OR DIAGNOSIS REQUIRED)    Answer:   breast calcifications on screening mammo LEFT breast    Order Specific Question:   Is the patient pregnant?    Answer:   No    Order Specific Question:   Preferred imaging location?    Answer:   [facility]

## 2017-05-15 ENCOUNTER — Encounter: Payer: Self-pay | Admitting: Family Medicine

## 2017-05-15 NOTE — Progress Notes (Signed)
Signing off on abstraction from Feb 2018

## 2017-05-15 NOTE — Progress Notes (Signed)
Closing out scanned document note open since  Feb 2018

## 2017-05-15 NOTE — Telephone Encounter (Signed)
Note to patient appt May 1st or later

## 2017-05-17 ENCOUNTER — Encounter: Payer: Self-pay | Admitting: Family Medicine

## 2017-05-23 ENCOUNTER — Other Ambulatory Visit: Payer: Self-pay | Admitting: Family Medicine

## 2017-05-26 ENCOUNTER — Ambulatory Visit
Admission: RE | Admit: 2017-05-26 | Discharge: 2017-05-26 | Disposition: A | Discharge status: Home / Self Care | Payer: 59 | Source: Ambulatory Visit | Attending: Family Medicine | Admitting: Family Medicine

## 2017-05-26 DIAGNOSIS — R921 Mammographic calcification found on diagnostic imaging of breast: Secondary | ICD-10-CM | POA: Diagnosis present

## 2017-06-05 ENCOUNTER — Other Ambulatory Visit: Payer: Self-pay | Admitting: Family Medicine

## 2017-06-05 MED ORDER — METFORMIN HCL ER 500 MG PO TB24: 2000.0000 mg | ORAL_TABLET | Freq: Every day | ORAL | 1 refills | Status: DC

## 2017-06-06 ENCOUNTER — Other Ambulatory Visit: Payer: Self-pay | Admitting: Family Medicine

## 2017-06-12 DIAGNOSIS — R0602 Shortness of breath: Secondary | ICD-10-CM | POA: Insufficient documentation

## 2017-06-22 DIAGNOSIS — M4322 Fusion of spine, cervical region: Secondary | ICD-10-CM | POA: Insufficient documentation

## 2017-06-30 ENCOUNTER — Other Ambulatory Visit: Payer: Self-pay | Admitting: Family Medicine

## 2017-06-30 ENCOUNTER — Other Ambulatory Visit: Payer: Self-pay

## 2017-06-30 DIAGNOSIS — E11319 Type 2 diabetes mellitus with unspecified diabetic retinopathy without macular edema: Secondary | ICD-10-CM

## 2017-06-30 DIAGNOSIS — E785 Hyperlipidemia, unspecified: Secondary | ICD-10-CM

## 2017-06-30 DIAGNOSIS — R740 Nonspecific elevation of levels of transaminase and lactic acid dehydrogenase [LDH]: Principal | ICD-10-CM

## 2017-06-30 DIAGNOSIS — R7401 Elevation of levels of liver transaminase levels: Secondary | ICD-10-CM

## 2017-06-30 NOTE — Telephone Encounter (Signed)
Pt notified, she does state just had surgery last week so will try to come in when she can.  She is taking 15 units of tresibia and it has been updated.  Labs added

## 2017-06-30 NOTE — Telephone Encounter (Signed)
Thank you rxs sent

## 2017-06-30 NOTE — Telephone Encounter (Signed)
Please ask patient to come in for the labs that were requested in February Also, please add a lipid panel to that batch (dx hyperlipidemia) so we can see if the statin is the right dose; add an ALT as well (elevated ALT) Also, I need to know exactly how much insulin she is using now to put on the prescription (please update med instructions) Thank you

## 2017-07-04 ENCOUNTER — Other Ambulatory Visit: Payer: Self-pay

## 2017-07-23 HISTORY — PX: CERVICAL FUSION: SHX112

## 2017-08-29 ENCOUNTER — Other Ambulatory Visit: Payer: Self-pay

## 2017-08-29 MED ORDER — ATORVASTATIN CALCIUM 20 MG PO TABS: 20.0000 mg | ORAL_TABLET | Freq: Every day | ORAL | 0 refills | Status: DC

## 2017-08-29 NOTE — Telephone Encounter (Signed)
Pt notified, but states she has an appt on the 17th and will get done then

## 2017-08-29 NOTE — Telephone Encounter (Signed)
Patient was supposed to have labs done around April 5th Please ask her to have those done ASAP She's now also overdue for her A1c; please order that as well We'll see her for appointment soon

## 2017-09-11 ENCOUNTER — Ambulatory Visit
Admission: RE | Admit: 2017-09-11 | Discharge: 2017-09-11 | Disposition: A | Discharge status: Home / Self Care | Payer: 59 | Source: Ambulatory Visit | Attending: Family Medicine | Admitting: Family Medicine

## 2017-09-11 ENCOUNTER — Ambulatory Visit: Payer: 59 | Admitting: Family Medicine

## 2017-09-11 ENCOUNTER — Encounter: Payer: Self-pay | Admitting: Family Medicine

## 2017-09-11 VITALS — BP 110/62 | HR 94 | Temp 98.3°F | Resp 14 | Ht 70.0 in | Wt 204.0 lb

## 2017-09-11 DIAGNOSIS — R059 Cough, unspecified: Secondary | ICD-10-CM

## 2017-09-11 DIAGNOSIS — E11319 Type 2 diabetes mellitus with unspecified diabetic retinopathy without macular edema: Secondary | ICD-10-CM | POA: Diagnosis not present

## 2017-09-11 DIAGNOSIS — Z5181 Encounter for therapeutic drug level monitoring: Secondary | ICD-10-CM | POA: Diagnosis not present

## 2017-09-11 DIAGNOSIS — R6889 Other general symptoms and signs: Secondary | ICD-10-CM

## 2017-09-11 DIAGNOSIS — Z1211 Encounter for screening for malignant neoplasm of colon: Secondary | ICD-10-CM

## 2017-09-11 DIAGNOSIS — R05 Cough: Secondary | ICD-10-CM | POA: Diagnosis present

## 2017-09-11 DIAGNOSIS — E785 Hyperlipidemia, unspecified: Secondary | ICD-10-CM

## 2017-09-11 DIAGNOSIS — R809 Proteinuria, unspecified: Secondary | ICD-10-CM

## 2017-09-11 DIAGNOSIS — E113299 Type 2 diabetes mellitus with mild nonproliferative diabetic retinopathy without macular edema, unspecified eye: Secondary | ICD-10-CM | POA: Diagnosis not present

## 2017-09-11 MED ORDER — CETIRIZINE HCL 10 MG PO TABS: 10.0000 mg | ORAL_TABLET | Freq: Every day | ORAL | 3 refills | Status: DC | PRN

## 2017-09-11 MED ORDER — METFORMIN HCL ER 500 MG PO TB24: 1000.0000 mg | ORAL_TABLET | Freq: Every day | ORAL | 1 refills | Status: DC

## 2017-09-11 NOTE — Assessment & Plan Note (Signed)
Last two readings have been normal; continue ACE-I for renal protection

## 2017-09-11 NOTE — Addendum Note (Signed)
Addended by: LADA, Satira Anis on: 09/11/2017 09:36 AM   Modules accepted: Orders

## 2017-09-11 NOTE — Patient Instructions (Signed)
Please have chest xray done across the street Let's get labs today If you have not heard anything from my staff in a week about any orders/referrals/studies from today, please contact us here to follow-up [phone]

## 2017-09-11 NOTE — Assessment & Plan Note (Signed)
Check lipids today; continue statin 

## 2017-09-11 NOTE — Progress Notes (Addendum)
BP 110/62   Pulse 94   Temp 98.3 F (36.8 C) (Oral)   Resp 14   Ht 5\' 10"  (1.778 m)   Wt 204 lb (92.5 kg)   SpO2 94%   BMI 29.27 kg/m    Subjective:    Patient ID: Breanna Rogers, female    DOB: 11/05/1965, 52 y.o.   MRN: [id number]  HPI: Breanna Rogers is a 52 y.o. female  Chief Complaint  Patient presents with  . Follow-up  . Medication Refill    HPI Patient is here for f/u  Type 2 diabetes mellitus; had been having diarrhea and weight loss; she stopped the tresiba; then she decreased her metformin and the diarrhea got much better; she has yogurt when she gets to work, something on her system; has supper around 6 pm, a salad or something healthy; checking FSBS and range 114 to 136 or so; no lows; eye exam UTD Lab Results  Component Value Date   HGBA1C 9.1 (H) 04/28/2017   High blood pressure; controlled with ACE-I  High cholesterol; not taking statin; just ran out but no problems; the seven day supply was going to cost more Lab Results  Component Value Date   CHOL 235 (H) 04/28/2017   HDL 59 04/28/2017   LDLCALC 134 (H) 04/28/2017   TRIG 275 (H) 04/28/2017   CHOLHDL 4.0 04/28/2017   Allergies; would like Rx for cetirizine  After neck surgery, C3-5 fusion with Dr. Owens Shark; going to PT  Chart review; she had a high calcium; f/u labs have not been done  After our visit,s he had two other things; dry skin on her scalp and feeling cold all the time    Office Visit from 09/11/2017 in [facility]  AUDIT-C Score  0      Depression screen [facility] 2/9 09/11/2017 04/28/2017 10/28/2016 06/21/2016 06/06/2016  Decreased Interest 0 0 0 0 0  Down, Depressed, Hopeless 0 1 0 0 0  PHQ - 2 Score 0 1 0 0 0  Altered sleeping 0 - - - -  Tired, decreased energy 0 - - - -  Change in appetite 0 - - - -  Feeling bad or failure about yourself  0 - - - -  Trouble concentrating 0 - - - -  Moving slowly or fidgety/restless 0 - - - -  Suicidal thoughts 0 - - - -  PHQ-9  Score 0 - - - -  Difficult doing work/chores Not difficult at all - - - -    Relevant past medical, surgical, family and social history reviewed Past Medical History:  Diagnosis Date  . Diabetes mellitus type 2 with retinopathy ([place])   . Elevated transaminase level   . Hot flashes   . Hyperlipidemia   . 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[place]) 4/15, 10/15   seen every 6 months  . Numbness of foot   . Obesity   . Pancreatitis   . Spasm of muscle   . Vitamin D deficiency    Past Surgical History:  Procedure Laterality Date  . CERVICAL FUSION  2013   C5-7 at [facility], (ACDF C5-6 with removal of hardware)  . CERVICAL FUSION  07/23/2017   C3-C7  . TONSILLECTOMY AND ADENOIDECTOMY  1976  . TUBAL LIGATION  1990   Family History  Problem Relation Age of Onset  . Cancer Mother        cervical  . Asthma Mother   . Diabetes Mother   .  Heart disease Mother   . Hyperlipidemia Mother   . Heart disease Father   . Hyperlipidemia Father   . Hypertension Father   . Heart attack Father   . Thyroid disease Sister   . Cancer Sister   . Diabetes Brother   . Hypertension Brother   . Diabetes Sister   . Heart attack Paternal Grandfather   . Diabetes Sister   . Heart disease Sister   . Heart attack Sister   . COPD Sister   . Stroke Neg Hx   . Breast cancer Neg Hx    Social History   Tobacco Use  . Smoking status: Former Smoker    Packs/day: 1.00    Years: 35.00    Pack years: 35.00    Types: Cigarettes    Last attempt to quit: 03/28/2010    Years since quitting: 7.4  . Smokeless tobacco: Never Used  Substance Use Topics  . Alcohol use: No  . Drug use: No    Interim medical history since last visit reviewed. Allergies and medications reviewed  Review of Systems Per HPI unless specifically indicated above     Objective:    BP 110/62   Pulse 94   Temp 98.3 F (36.8 C) (Oral)   Resp 14   Ht 5\' 10"  (1.778 m)   Wt 204 lb (92.5 kg)   SpO2 94%   BMI 29.27 kg/m     Wt Readings from Last 3 Encounters:  09/11/17 204 lb (92.5 kg)  04/28/17 224 lb 1.6 oz (101.7 kg)  10/28/16 212 lb 9.6 oz (96.4 kg)    Physical Exam  Constitutional: She appears well-developed and well-nourished. No distress.  HENT:  Head: Normocephalic and atraumatic.  Eyes: EOM are normal. No scleral icterus.  Neck: Decreased range of motion present. No thyromegaly present.    Surgical scars anterior neck; limited ROM  Cardiovascular: Normal rate, regular rhythm and normal heart sounds.  No murmur heard. Pulmonary/Chest: Effort normal. No respiratory distress. She has decreased breath sounds in the left lower field. She has no wheezes.  Abdominal: Soft. Bowel sounds are normal. She exhibits no distension.  Musculoskeletal: She exhibits no edema.  Neurological: She is alert. She exhibits normal muscle tone.  Skin: Skin is warm and dry. She is not diaphoretic. No pallor.  Patch of dry skin about 8 mm x 10 mm on the scalp; no erythema; nailbeds not especially pale  Psychiatric: She has a normal mood and affect. Her behavior is normal. Judgment and thought content normal. Her mood appears not anxious. She does not exhibit a depressed mood.   Diabetic Foot Form - Detailed   Diabetic Foot Exam - detailed Diabetic Foot exam was performed with the following findings:  Yes 09/11/2017  9:17 AM  Visual Foot Exam completed.:  Yes  Pulse Foot Exam completed.:  Yes  Right Dorsalis Pedis:  Present Left Dorsalis Pedis:  Present  Sensory Foot Exam Completed.:  Yes Semmes-Weinstein Monofilament Test R Site 1-Great Toe:  Pos L Site 1-Great Toe:  Pos        Results for orders placed or performed in visit on 04/28/17  Microalbumin / creatinine urine ratio  Result Value Ref Range   Creatinine, Urine 46 20 - 275 mg/dL   Microalb, Ur 1.0 mg/dL   Microalb Creat Ratio 22 <30 mcg/mg creat  Lipid panel  Result Value Ref Range   Cholesterol 235 (H) <200 mg/dL   HDL 59 >50 mg/dL   Triglycerides  275 (  H) <150 mg/dL   LDL Cholesterol (Calc) 134 (H) mg/dL (calc)   Total CHOL/HDL Ratio 4.0 <5.0 (calc)   Non-HDL Cholesterol (Calc) 176 (H) <130 mg/dL (calc)  Hemoglobin A1c  Result Value Ref Range   Hgb A1c MFr Bld 9.1 (H) <5.7 % of total Hgb   Mean Plasma Glucose 214 (calc)   eAG (mmol/L) 11.9 (calc)  COMPLETE METABOLIC PANEL WITH GFR  Result Value Ref Range   Glucose, Bld 154 (H) 65 - 99 mg/dL   BUN 13 7 - 25 mg/dL   Creat 0.67 0.50 - 1.05 mg/dL   GFR, Est Non African American 102 > OR = 60 mL/min/1.89m2   GFR, Est African American 118 > OR = 60 mL/min/1.65m2   BUN/Creatinine Ratio NOT APPLICABLE 6 - 22 (calc)   Sodium 137 135 - 146 mmol/L   Potassium 4.5 3.5 - 5.3 mmol/L   Chloride 99 98 - 110 mmol/L   CO2 29 20 - 32 mmol/L   Calcium 10.9 (H) 8.6 - 10.4 mg/dL   Total Protein 8.1 6.1 - 8.1 g/dL   Albumin 5.0 3.6 - 5.1 g/dL   Globulin 3.1 1.9 - 3.7 g/dL (calc)   AG Ratio 1.6 1.0 - 2.5 (calc)   Total Bilirubin 0.6 0.2 - 1.2 mg/dL   Alkaline phosphatase (APISO) 70 33 - 130 U/L   AST 26 10 - 35 U/L   ALT 40 (H) 6 - 29 U/L  TSH  Result Value Ref Range   TSH 2.20 mIU/L  CBC  Result Value Ref Range   WBC 8.5 3.8 - 10.8 Thousand/uL   RBC 4.66 3.80 - 5.10 Million/uL   Hemoglobin 14.2 11.7 - 15.5 g/dL   HCT 41.0 35.0 - 45.0 %   MCV 88.0 80.0 - 100.0 fL   MCH 30.5 27.0 - 33.0 pg   MCHC 34.6 32.0 - 36.0 g/dL   RDW 12.0 11.0 - 15.0 %   Platelets 269 140 - 400 Thousand/uL   MPV 9.9 7.5 - 12.5 fL  D-Dimer, Quantitative  Result Value Ref Range   D-Dimer, Quant <0.19 <0.50 mcg/mL FEU  Troponin I  Result Value Ref Range   Troponin I <0.01 < OR = 0.0 ng/mL      Assessment & Plan:   Problem List Items Addressed This Visit      Endocrine   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HCC) (Chronic)   Relevant Medications   metFORMIN (GLUCOPHAGE-XR) 500 MG 24 hr tablet   Diabetes mellitus type 2 with retinopathy (HCC) - Primary (Chronic)   Relevant Medications   metFORMIN  (GLUCOPHAGE-XR) 500 MG 24 hr tablet   Other Relevant Orders   Hemoglobin A1C     Other   Medication monitoring encounter   Relevant Orders   COMPLETE METABOLIC PANEL WITH GFR   Urine test positive for microalbuminuria    Last two readings have been normal; continue ACE-I for renal protection      Dyslipidemia    Check lipids today; continue statin      Relevant Orders   Lipid panel    Other Visit Diagnoses    Screen for colon cancer       Relevant Orders   Ambulatory referral to Gastroenterology   Hypercalcemia       Relevant Orders   VITAMIN D 25 Hydroxy (Vit-D Deficiency, Fractures)   Phosphorus   COMPLETE METABOLIC PANEL WITH GFR   Cough       Relevant Orders   DG Chest 2 View  Cold intolerance       Relevant Orders   CBC with Differential/Platelet   TSH       Follow up plan: Return in about 3 months (around 12/12/2017) for CPE, follow-up visit with Dr. Sanda Klein; CPE with pap smear in the next month.  An after-visit summary was printed and given to the patient at [place].  Please see the patient instructions which may contain other information and recommendations beyond what is mentioned above in the assessment and plan.  Meds ordered this encounter  Medications  . cetirizine (ZYRTEC) 10 MG tablet    Sig: Take 1 tablet (10 mg total) by mouth daily as needed.    Dispense:  90 tablet    Refill:  3  . metFORMIN (GLUCOPHAGE-XR) 500 MG 24 hr tablet    Sig: Take 2 tablets (1,000 mg total) by mouth daily with breakfast.    Dispense:  180 tablet    Refill:  1    Orders Placed This Encounter  Procedures  . DG Chest 2 View  . Hemoglobin A1C  . Lipid panel  . VITAMIN D 25 Hydroxy (Vit-D Deficiency, Fractures)  . Phosphorus  . COMPLETE METABOLIC PANEL WITH GFR  . CBC with Differential/Platelet  . TSH  . Ambulatory referral to Gastroenterology

## 2017-09-12 ENCOUNTER — Other Ambulatory Visit: Payer: Self-pay | Admitting: Family Medicine

## 2017-09-12 ENCOUNTER — Other Ambulatory Visit: Payer: Self-pay

## 2017-09-12 DIAGNOSIS — Z1211 Encounter for screening for malignant neoplasm of colon: Secondary | ICD-10-CM

## 2017-09-12 DIAGNOSIS — D721 Eosinophilia, unspecified: Secondary | ICD-10-CM

## 2017-09-12 DIAGNOSIS — R634 Abnormal weight loss: Secondary | ICD-10-CM

## 2017-09-12 DIAGNOSIS — R197 Diarrhea, unspecified: Secondary | ICD-10-CM

## 2017-09-12 LAB — COMPLETE METABOLIC PANEL WITH GFR
AG RATIO: 1.7 (calc) (ref 1.0–2.5)
ALBUMIN MSPROF: 4.6 g/dL (ref 3.6–5.1)
ALKALINE PHOSPHATASE (APISO): 77 U/L (ref 33–130)
ALT: 16 U/L (ref 6–29)
AST: 14 U/L (ref 10–35)
BUN: 11 mg/dL (ref 7–25)
CHLORIDE: 104 mmol/L (ref 98–110)
CO2: 27 mmol/L (ref 20–32)
Calcium: 10.3 mg/dL (ref 8.6–10.4)
Creat: 0.73 mg/dL (ref 0.50–1.05)
GFR, EST AFRICAN AMERICAN: 111 mL/min/{1.73_m2} (ref >=60)
GFR, Est Non African American: 95 mL/min/{1.73_m2} (ref >=60)
GLUCOSE: 131 mg/dL — AB (ref 65–99)
Globulin: 2.7 g/dL (calc) (ref 1.9–3.7)
POTASSIUM: 4.9 mmol/L (ref 3.5–5.3)
SODIUM: 141 mmol/L (ref 135–146)
TOTAL PROTEIN: 7.3 g/dL (ref 6.1–8.1)
Total Bilirubin: 0.4 mg/dL (ref 0.2–1.2)

## 2017-09-12 LAB — LIPID PANEL
CHOLESTEROL: 249 mg/dL — AB (ref <200)
HDL: 56 mg/dL (ref >50)
LDL CHOLESTEROL (CALC): 157 mg/dL — AB
NON-HDL CHOLESTEROL (CALC): 193 mg/dL — AB (ref <130)
Total CHOL/HDL Ratio: 4.4 (calc) (ref <5.0)
Triglycerides: 197 mg/dL — ABNORMAL HIGH (ref <150)

## 2017-09-12 LAB — PHOSPHORUS: PHOSPHORUS: 4.1 mg/dL (ref 2.5–4.5)

## 2017-09-12 LAB — CBC WITH DIFFERENTIAL/PLATELET
BASOS PCT: 2 %
Basophils Absolute: 172 cells/uL (ref 0–200)
EOS PCT: 29.7 %
Eosinophils Absolute: 2554 cells/uL — ABNORMAL HIGH (ref 15–500)
HCT: 37.8 % (ref 35.0–45.0)
Hemoglobin: 13.1 g/dL (ref 11.7–15.5)
Lymphs Abs: 2038 cells/uL (ref 850–3900)
MCH: 30.6 pg (ref 27.0–33.0)
MCHC: 34.7 g/dL (ref 32.0–36.0)
MCV: 88.3 fL (ref 80.0–100.0)
MONOS PCT: 5.1 %
MPV: 10 fL (ref 7.5–12.5)
Neutro Abs: 3397 cells/uL (ref 1500–7800)
Neutrophils Relative %: 39.5 %
PLATELETS: 285 10*3/uL (ref 140–400)
RBC: 4.28 10*6/uL (ref 3.80–5.10)
RDW: 12.8 % (ref 11.0–15.0)
TOTAL LYMPHOCYTE: 23.7 %
WBC: 8.6 10*3/uL (ref 3.8–10.8)
WBCMIX: 439 {cells}/uL (ref 200–950)

## 2017-09-12 LAB — HEMOGLOBIN A1C
HEMOGLOBIN A1C: 6.3 %{Hb} — AB (ref <5.7)
Mean Plasma Glucose: 134 (calc)
eAG (mmol/L): 7.4 (calc)

## 2017-09-12 LAB — TSH: TSH: 1.48 mIU/L

## 2017-09-12 LAB — VITAMIN D 25 HYDROXY (VIT D DEFICIENCY, FRACTURES): Vit D, 25-Hydroxy: 37 ng/mL (ref 30–100)

## 2017-09-12 MED ORDER — ATORVASTATIN CALCIUM 40 MG PO TABS: 40.0000 mg | ORAL_TABLET | Freq: Every day | ORAL | 1 refills | Status: DC

## 2017-09-12 NOTE — Progress Notes (Signed)
Increase statin Elevated EOS; start work-up; labs, refer to GI

## 2017-09-13 ENCOUNTER — Other Ambulatory Visit: Payer: Self-pay

## 2017-09-13 ENCOUNTER — Telehealth: Payer: Self-pay | Admitting: Family Medicine

## 2017-09-13 DIAGNOSIS — D721 Eosinophilia, unspecified: Secondary | ICD-10-CM

## 2017-09-13 DIAGNOSIS — R634 Abnormal weight loss: Secondary | ICD-10-CM

## 2017-09-13 NOTE — Telephone Encounter (Signed)
Pt.notified

## 2017-09-13 NOTE — Telephone Encounter (Signed)
Copied from [place] [id number]. Topic: Quick Communication - See Telephone Encounter >> Sep 13, 2017  9:06 AM Cleaster Corin, NT wrote: CRM for notification. See Telephone encounter for: 09/13/17.  Pt. Calling in reference to ultra sound and labs that need to be done would like to speak with nurse with further questions pt. Can be reached at [phone]

## 2017-09-15 ENCOUNTER — Other Ambulatory Visit: Payer: Self-pay

## 2017-09-18 ENCOUNTER — Encounter: Payer: Self-pay | Admitting: Family Medicine

## 2017-09-19 ENCOUNTER — Encounter: Payer: Self-pay | Admitting: Family Medicine

## 2017-09-19 ENCOUNTER — Ambulatory Visit
Admission: RE | Admit: 2017-09-19 | Discharge: 2017-09-19 | Disposition: A | Discharge status: Home / Self Care | Payer: 59 | Source: Ambulatory Visit | Attending: Family Medicine | Admitting: Family Medicine

## 2017-09-19 DIAGNOSIS — D721 Eosinophilia, unspecified: Secondary | ICD-10-CM

## 2017-09-19 DIAGNOSIS — R634 Abnormal weight loss: Secondary | ICD-10-CM | POA: Diagnosis not present

## 2017-09-19 DIAGNOSIS — I700 Atherosclerosis of aorta: Secondary | ICD-10-CM | POA: Insufficient documentation

## 2017-09-19 HISTORY — DX: Atherosclerosis of aorta: I70.0

## 2017-09-19 MED ORDER — IOPAMIDOL (ISOVUE-300) INJECTION 61%
100.0000 mL | Freq: Once | INTRAVENOUS | Status: AC | PRN
  Administered 2017-09-19: 100 mL via INTRAVENOUS

## 2017-09-21 LAB — CBC WITH DIFFERENTIAL/PLATELET
BASOS ABS: 131 {cells}/uL (ref 0–200)
Basophils Relative: 1.8 %
EOS ABS: 1635 {cells}/uL — AB (ref 15–500)
EOS PCT: 22.4 %
HCT: 36.6 % (ref 35.0–45.0)
Hemoglobin: 12.5 g/dL (ref 11.7–15.5)
Lymphs Abs: 2219 cells/uL (ref 850–3900)
MCH: 29.9 pg (ref 27.0–33.0)
MCHC: 34.2 g/dL (ref 32.0–36.0)
MCV: 87.6 fL (ref 80.0–100.0)
MONOS PCT: 7.2 %
MPV: 10.2 fL (ref 7.5–12.5)
NEUTROS ABS: 2789 {cells}/uL (ref 1500–7800)
Neutrophils Relative %: 38.2 %
Platelets: 256 10*3/uL (ref 140–400)
RBC: 4.18 10*6/uL (ref 3.80–5.10)
RDW: 12.5 % (ref 11.0–15.0)
TOTAL LYMPHOCYTE: 30.4 %
WBC mixed population: 526 cells/uL (ref 200–950)
WBC: 7.3 10*3/uL (ref 3.8–10.8)

## 2017-09-21 LAB — INFLAM. BOWEL DISEASE DIFF. PANEL
ANCA SCREEN: NEGATIVE
SACCHAROMYCES CEREVISIAE AB (ASCA)(IGA): 20.5 U — ABNORMAL HIGH (ref <=20.0)
SACCHAROMYCES CEREVISIAE AB (ASCA)(IGG): 11.1 U (ref <=20.0)

## 2017-09-21 LAB — PATHOLOGIST SMEAR REVIEW

## 2017-09-22 ENCOUNTER — Encounter: Payer: Self-pay | Admitting: Family Medicine

## 2017-09-25 LAB — HM DIABETES EYE EXAM

## 2017-09-26 ENCOUNTER — Ambulatory Visit: Admission: RE | Admit: 2017-09-26 | Payer: 59 | Source: Ambulatory Visit | Admitting: Gastroenterology

## 2017-09-26 ENCOUNTER — Encounter: Admission: RE | Payer: Self-pay | Source: Ambulatory Visit

## 2017-09-26 SURGERY — COLONOSCOPY WITH PROPOFOL
Anesthesia: General

## 2017-09-27 ENCOUNTER — Encounter: Payer: Self-pay | Admitting: Family Medicine

## 2017-10-04 ENCOUNTER — Encounter: Payer: Self-pay | Admitting: Family Medicine

## 2017-10-04 ENCOUNTER — Other Ambulatory Visit (HOSPITAL_COMMUNITY)
Admission: RE | Admit: 2017-10-04 | Discharge: 2017-10-04 | Disposition: A | Discharge status: Home / Self Care | Payer: 59 | Source: Ambulatory Visit | Attending: Family Medicine | Admitting: Family Medicine

## 2017-10-04 ENCOUNTER — Ambulatory Visit (INDEPENDENT_AMBULATORY_CARE_PROVIDER_SITE_OTHER): Payer: 59 | Admitting: Family Medicine

## 2017-10-04 VITALS — BP 116/62 | HR 99 | Temp 98.3°F | Resp 14 | Ht 69.0 in | Wt 200.9 lb

## 2017-10-04 DIAGNOSIS — Z0000 Encounter for general adult medical examination without abnormal findings: Secondary | ICD-10-CM | POA: Diagnosis not present

## 2017-10-04 DIAGNOSIS — Z124 Encounter for screening for malignant neoplasm of cervix: Secondary | ICD-10-CM | POA: Diagnosis not present

## 2017-10-04 DIAGNOSIS — Z1211 Encounter for screening for malignant neoplasm of colon: Secondary | ICD-10-CM | POA: Diagnosis not present

## 2017-10-04 MED ORDER — SERTRALINE HCL 100 MG PO TABS: 150.0000 mg | ORAL_TABLET | Freq: Every day | ORAL | 5 refills | Status: DC

## 2017-10-04 NOTE — Assessment & Plan Note (Signed)
USPSTF grade A and B recommendations reviewed with patient; age-appropriate recommendations, preventive care, screening tests, etc discussed and encouraged; healthy living encouraged; see AVS for patient education given to patient  

## 2017-10-04 NOTE — Patient Instructions (Addendum)
Consider getting the new shingles vaccine called Shingrix; that is available for individuals 52 years of age and older, and is recommended even if you have had shingles in the past and/or already received the old shingles vaccine (Zostavax); it is a two-part series, and is available at many local pharmacies  Health Maintenance, Female Adopting a healthy lifestyle and getting preventive care can go a long way to promote health and wellness. Talk with your health care provider about what schedule of regular examinations is right for you. This is a good chance for you to check in with your provider about disease prevention and staying healthy. In between checkups, there are plenty of things you can do on your own. Experts have done a lot of research about which lifestyle changes and preventive measures are most likely to keep you healthy. Ask your health care provider for more information. Weight and diet Eat a healthy diet  Be sure to include plenty of vegetables, fruits, low-fat dairy products, and lean protein.  Do not eat a lot of foods high in solid fats, added sugars, or salt.  Get regular exercise. This is one of the most important things you can do for your health. ? Most adults should exercise for at least 150 minutes each week. The exercise should increase your heart rate and make you sweat (moderate-intensity exercise). ? Most adults should also do strengthening exercises at least twice a week. This is in addition to the moderate-intensity exercise.  Maintain a healthy weight  Body mass index (BMI) is a measurement that can be used to identify possible weight problems. It estimates body fat based on height and weight. Your health care provider can help determine your BMI and help you achieve or maintain a healthy weight.  For females 20 years of age and older: ? A BMI below 18.5 is considered underweight. ? A BMI of 18.5 to 24.9 is normal. ? A BMI of 25 to 29.9 is considered  overweight. ? A BMI of 30 and above is considered obese.  Watch levels of cholesterol and blood lipids  You should start having your blood tested for lipids and cholesterol at 52 years of age, then have this test every 5 years.  You may need to have your cholesterol levels checked more often if: ? Your lipid or cholesterol levels are high. ? You are older than 52 years of age. ? You are at high risk for heart disease.  Cancer screening Lung Cancer  Lung cancer screening is recommended for adults 55-80 years old who are at high risk for lung cancer because of a history of smoking.  A yearly low-dose CT scan of the lungs is recommended for people who: ? Currently smoke. ? Have quit within the past 15 years. ? Have at least a 30-pack-year history of smoking. A pack year is smoking an average of one pack of cigarettes a day for 1 year.  Yearly screening should continue until it has been 15 years since you quit.  Yearly screening should stop if you develop a health problem that would prevent you from having lung cancer treatment.  Breast Cancer  Practice breast self-awareness. This means understanding how your breasts normally appear and feel.  It also means doing regular breast self-exams. Let your health care provider know about any changes, no matter how small.  If you are in your 20s or 30s, you should have a clinical breast exam (CBE) by a health care provider every 1-3 years as part   of a regular health exam.  If you are 30 or older, have a CBE every year. Also consider having a breast X-ray (mammogram) every year.  If you have a family history of breast cancer, talk to your health care provider about genetic screening.  If you are at high risk for breast cancer, talk to your health care provider about having an MRI and a mammogram every year.  Breast cancer gene (BRCA) assessment is recommended for women who have family members with BRCA-related cancers. BRCA-related cancers  include: ? Breast. ? Ovarian. ? Tubal. ? Peritoneal cancers.  Results of the assessment will determine the need for genetic counseling and BRCA1 and BRCA2 testing.  Cervical Cancer Your health care provider may recommend that you be screened regularly for cancer of the pelvic organs (ovaries, uterus, and vagina). This screening involves a pelvic examination, including checking for microscopic changes to the surface of your cervix (Pap test). You may be encouraged to have this screening done every 3 years, beginning at age 70.  For women ages 17-65, health care providers may recommend pelvic exams and Pap testing every 3 years, or they may recommend the Pap and pelvic exam, combined with testing for human papilloma virus (HPV), every 5 years. Some types of HPV increase your risk of cervical cancer. Testing for HPV may also be done on women of any age with unclear Pap test results.  Other health care providers may not recommend any screening for nonpregnant women who are considered low risk for pelvic cancer and who do not have symptoms. Ask your health care provider if a screening pelvic exam is right for you.  If you have had past treatment for cervical cancer or a condition that could lead to cancer, you need Pap tests and screening for cancer for at least 20 years after your treatment. If Pap tests have been discontinued, your risk factors (such as having a new sexual partner) need to be reassessed to determine if screening should resume. Some women have medical problems that increase the chance of getting cervical cancer. In these cases, your health care provider may recommend more frequent screening and Pap tests.  Colorectal Cancer  This type of cancer can be detected and often prevented.  Routine colorectal cancer screening usually begins at 52 years of age and continues through 52 years of age.  Your health care provider may recommend screening at an earlier age if you have risk factors  for colon cancer.  Your health care provider may also recommend using home test kits to check for hidden blood in the stool.  A small camera at the end of a tube can be used to examine your colon directly (sigmoidoscopy or colonoscopy). This is done to check for the earliest forms of colorectal cancer.  Routine screening usually begins at age 85.  Direct examination of the colon should be repeated every 5-10 years through 52 years of age. However, you may need to be screened more often if early forms of precancerous polyps or small growths are found.  Skin Cancer  Check your skin from head to toe regularly.  Tell your health care provider about any new moles or changes in moles, especially if there is a change in a mole's shape or color.  Also tell your health care provider if you have a mole that is larger than the size of a pencil eraser.  Always use sunscreen. Apply sunscreen liberally and repeatedly throughout the day.  Protect yourself by wearing long  sleeves, pants, a wide-brimmed hat, and sunglasses whenever you are outside.  Heart disease, diabetes, and high blood pressure  High blood pressure causes heart disease and increases the risk of stroke. High blood pressure is more likely to develop in: ? People who have blood pressure in the high end of the normal range (130-139/85-89 mm Hg). ? People who are overweight or obese. ? People who are African American.  If you are 93-79 years of age, have your blood pressure checked every 3-5 years. If you are 85 years of age or older, have your blood pressure checked every year. You should have your blood pressure measured twice-once when you are at a hospital or clinic, and once when you are not at a hospital or clinic. Record the average of the two measurements. To check your blood pressure when you are not at a hospital or clinic, you can use: ? An automated blood pressure machine at a pharmacy. ? A home blood pressure monitor.  If  you are between 74 years and 26 years old, ask your health care provider if you should take aspirin to prevent strokes.  Have regular diabetes screenings. This involves taking a blood sample to check your fasting blood sugar level. ? If you are at a normal weight and have a low risk for diabetes, have this test once every three years after 52 years of age. ? If you are overweight and have a high risk for diabetes, consider being tested at a younger age or more often. Preventing infection Hepatitis B  If you have a higher risk for hepatitis B, you should be screened for this virus. You are considered at high risk for hepatitis B if: ? You were born in a country where hepatitis B is common. Ask your health care provider which countries are considered high risk. ? Your parents were born in a high-risk country, and you have not been immunized against hepatitis B (hepatitis B vaccine). ? You have HIV or AIDS. ? You use needles to inject street drugs. ? You live with someone who has hepatitis B. ? You have had sex with someone who has hepatitis B. ? You get hemodialysis treatment. ? You take certain medicines for conditions, including cancer, organ transplantation, and autoimmune conditions.  Hepatitis C  Blood testing is recommended for: ? Everyone born from [birth year] through 1965. ? Anyone with known risk factors for hepatitis C.  Sexually transmitted infections (STIs)  You should be screened for sexually transmitted infections (STIs) including gonorrhea and chlamydia if: ? You are sexually active and are younger than 52 years of age. ? You are older than 52 years of age and your health care provider tells you that you are at risk for this type of infection. ? Your sexual activity has changed since you were last screened and you are at an increased risk for chlamydia or gonorrhea. Ask your health care provider if you are at risk.  If you do not have HIV, but are at risk, it may be recommended  that you take a prescription medicine daily to prevent HIV infection. This is called pre-exposure prophylaxis (PrEP). You are considered at risk if: ? You are sexually active and do not regularly use condoms or know the HIV status of your partner(s). ? You take drugs by injection. ? You are sexually active with a partner who has HIV.  Talk with your health care provider about whether you are at high risk of being infected with HIV. If  you choose to begin PrEP, you should first be tested for HIV. You should then be tested every 3 months for as long as you are taking PrEP. Pregnancy  If you are premenopausal and you may become pregnant, ask your health care provider about preconception counseling.  If you may become pregnant, take 400 to 800 micrograms (mcg) of folic acid every day.  If you want to prevent pregnancy, talk to your health care provider about birth control (contraception). Osteoporosis and menopause  Osteoporosis is a disease in which the bones lose minerals and strength with aging. This can result in serious bone fractures. Your risk for osteoporosis can be identified using a bone density scan.  If you are 51 years of age or older, or if you are at risk for osteoporosis and fractures, ask your health care provider if you should be screened.  Ask your health care provider whether you should take a calcium or vitamin D supplement to lower your risk for osteoporosis.  Menopause may have certain physical symptoms and risks.  Hormone replacement therapy may reduce some of these symptoms and risks. Talk to your health care provider about whether hormone replacement therapy is right for you. Follow these instructions at home:  Schedule regular health, dental, and eye exams.  Stay current with your immunizations.  Do not use any tobacco products including cigarettes, chewing tobacco, or electronic cigarettes.  If you are pregnant, do not drink alcohol.  If you are  breastfeeding, limit how much and how often you drink alcohol.  Limit alcohol intake to no more than 1 drink per day for nonpregnant women. One drink equals 12 ounces of beer, 5 ounces of wine, or 1 ounces of hard liquor.  Do not use street drugs.  Do not share needles.  Ask your health care provider for help if you need support or information about quitting drugs.  Tell your health care provider if you often feel depressed.  Tell your health care provider if you have ever been abused or do not feel safe at home. This information is not intended to replace advice given to you by your health care provider. Make sure you discuss any questions you have with your health care provider. Document Released: 09/27/2010 Document Revised: 08/20/2015 Document Reviewed: 12/16/2014 Elsevier Interactive Patient Education  [employer].

## 2017-10-04 NOTE — Progress Notes (Signed)
Patient ID: Breanna Rogers, female   DOB: [birth year], 52 y.o.   MRN: [id number]   Subjective:   Breanna Rogers is a 52 y.o. female here for a complete physical exam  Interim issues since last visit: diarrhea has resolved; seeing GI, going to have colonoscopy soon  USPSTF grade A and B recommendations Depression:  Depression screen [facility] 2/9 10/04/2017 09/11/2017 04/28/2017 10/28/2016 06/21/2016  Decreased Interest 0 0 0 0 0  Down, Depressed, Hopeless 0 0 1 0 0  PHQ - 2 Score 0 0 1 0 0  Altered sleeping 0 0 - - -  Tired, decreased energy 1 0 - - -  Change in appetite 0 0 - - -  Feeling bad or failure about yourself  0 0 - - -  Trouble concentrating 2 0 - - -  Moving slowly or fidgety/restless 0 0 - - -  Suicidal thoughts 0 0 - - -  PHQ-9 Score 3 0 - - -  Difficult doing work/chores Not difficult at all Not difficult at all - - -   Hypertension: BP Readings from Last 3 Encounters:  10/04/17 116/62  09/11/17 110/62  04/28/17 124/82   Obesity: Wt Readings from Last 3 Encounters:  10/04/17 200 lb 14.4 oz (91.1 kg)  09/11/17 204 lb (92.5 kg)  04/28/17 224 lb 1.6 oz (101.7 kg)   BMI Readings from Last 3 Encounters:  10/04/17 29.67 kg/m  09/11/17 29.27 kg/m  04/28/17 32.16 kg/m    Skin cancer: no worrisome moles Lung cancer:  n/a Breast cancer: checking breasts; no lumps; recent mammo Colorectal cancer: having colonoscopy soon Cervical cancer screening: today BRCA gene screening: family hx of breast and/or ovarian cancer and/or metastatic prostate cancer? no Intimate partner violence: no abuse Contraception: n/a Osteoporosis: n/a Immunizations: offered shingrix, politely declined Diet: trying to do well Exercise: trying Alcohol:    [facility]/04/[facility]  AUDIT-C Score  0     Tobacco use: quit years ago AAA: n/a Aspirin: taking Glucose:  Glucose  Date Value Ref Range Status  03/26/2013 135 (H) 65 - 99 mg/dL Final   Glucose,  Bld  Date Value Ref Range Status  09/11/2017 131 (H) 65 - 99 mg/dL Final    Comment:    .            Fasting reference interval . For someone without known diabetes, a glucose value >125 mg/dL indicates that they may have diabetes and this should be confirmed with a follow-up test. .   04/28/2017 154 (H) 65 - 99 mg/dL Final    Comment:    .            Fasting reference interval . For someone without known diabetes, a glucose value >125 mg/dL indicates that they may have diabetes and this should be confirmed with a follow-up test. .   06/07/2016 94 65 - 99 mg/dL Final   Glucose-Capillary  Date Value Ref Range Status  06/08/2016 250 (H) 65 - 99 mg/dL Final  06/08/2016 116 (H) 65 - 99 mg/dL Final  06/07/2016 103 (H) 65 - 99 mg/dL Final   Lipids:  Lab Results  Component Value Date   CHOL 249 (H) 09/11/2017   CHOL 235 (H) 04/28/2017   CHOL 164 06/06/2016   Lab Results  Component Value Date   HDL 56 09/11/2017   HDL 59 04/28/2017   HDL 59 06/06/2016   Lab Results  Component Value Date  LDLCALC 157 (H) 09/11/2017   LDLCALC 134 (H) 04/28/2017   LDLCALC 68 06/06/2016   Lab Results  Component Value Date   TRIG 197 (H) 09/11/2017   TRIG 275 (H) 04/28/2017   TRIG 187 (H) 06/06/2016   Lab Results  Component Value Date   CHOLHDL 4.4 09/11/2017   CHOLHDL 4.0 04/28/2017   CHOLHDL 2.8 06/06/2016   No results found for: LDLDIRECT   Past Medical History:  Diagnosis Date  . Abdominal aortic atherosclerosis ([place]) 09/19/2017  . Diabetes mellitus type 2 with retinopathy ([place])   . Elevated transaminase level   . Hot flashes   . Hyperlipidemia   . 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 Numbness of foot   . Obesity   . Pancreatitis   . Spasm of muscle   . Vitamin D deficiency    Past Surgical History:  Procedure Laterality Date  . CERVICAL FUSION  2013   C5-7 at [facility], (ACDF C5-6 with removal of hardware)  .  CERVICAL FUSION  07/23/2017   C3-C7  . TONSILLECTOMY AND ADENOIDECTOMY  1976  . TUBAL LIGATION  1990   Family History  Problem Relation Age of Onset  . Cancer Mother        cervical  . Asthma Mother   . Diabetes Mother   . Heart disease Mother   . Hyperlipidemia Mother   . Heart disease Father   . Hyperlipidemia Father   . Hypertension Father   . Heart attack Father   . Thyroid disease Sister   . Cancer Sister   . Diabetes Brother   . Hypertension Brother   . Diabetes Sister   . Heart attack Paternal Grandfather   . Diabetes Sister   . Heart disease Sister   . Heart attack Sister   . COPD Sister   . Stroke Neg Hx   . Breast cancer Neg Hx    Social History   Tobacco Use  . Smoking status: Former Smoker    Packs/day: 1.00    Years: 35.00    Pack years: 35.00    Types: Cigarettes    Last attempt to quit: 03/28/2010    Years since quitting: 7.5  . Smokeless tobacco: Never Used  Substance Use Topics  . Alcohol use: No  . Drug use: No   Review of Systems  Constitutional: Negative for unexpected weight change.  HENT: Negative for mouth sores.   Eyes: Negative for visual disturbance.  Respiratory: Negative for shortness of breath.   Cardiovascular: Negative for chest pain.  Gastrointestinal: Negative for blood in stool.  Endocrine: Positive for cold intolerance (stays cold all the time). Negative for polydipsia.  Genitourinary: Negative for hematuria.  Musculoskeletal: Negative for joint swelling.  Skin:       No unusual moles  Allergic/Immunologic: Negative for food allergies.  Neurological: Negative for tremors.  sertraline for hot flashes; short-tempered  Objective:   Vitals:   10/04/17 0903  BP: 116/62  Pulse: 99  Resp: 14  Temp: 98.3 F (36.8 C)  TempSrc: Oral  SpO2: 95%  Weight: 200 lb 14.4 oz (91.1 kg)  Height: '5\' 9"'$  (1.753 m)   Body mass index is 29.67 kg/m. Wt Readings from Last 3 Encounters:  10/04/17 200 lb 14.4 oz (91.1 kg)  09/11/17  204 lb (92.5 kg)  04/28/17 224 lb 1.6 oz (101.7 kg)   Physical Exam  Constitutional: She appears well-developed and well-nourished.  HENT:  Head: Normocephalic  and atraumatic.  Eyes: Conjunctivae and EOM are normal. Right eye exhibits no hordeolum. Left eye exhibits no hordeolum. No scleral icterus.  Neck: Carotid bruit is not present. No thyromegaly present.  Cardiovascular: Normal rate, regular rhythm, S1 normal, S2 normal and normal heart sounds.  No extrasystoles are present.  Pulmonary/Chest: Effort normal and breath sounds normal. No respiratory distress. Right breast exhibits no inverted nipple, no mass, no nipple discharge, no skin change and no tenderness. Left breast exhibits no inverted nipple, no mass, no nipple discharge, no skin change and no tenderness. Breasts are symmetrical.  Abdominal: Soft. Normal appearance and bowel sounds are normal. She exhibits no distension, no abdominal bruit, no pulsatile midline mass and no mass. There is no hepatosplenomegaly. There is no tenderness. No hernia.  Genitourinary: Uterus normal. Pelvic exam was performed with patient prone. There is no rash or lesion on the right labia. There is no rash or lesion on the left labia. Cervix exhibits no motion tenderness. Right adnexum displays no mass, no tenderness and no fullness. Left adnexum displays no mass, no tenderness and no fullness.  Musculoskeletal: Normal range of motion. She exhibits no edema.  Lymphadenopathy:       Head (right side): No submandibular adenopathy present.       Head (left side): No submandibular adenopathy present.    She has no cervical adenopathy.    She has no axillary adenopathy.  Neurological: She is alert. She displays no tremor. No cranial nerve deficit. She exhibits normal muscle tone. Gait normal.  Skin: Skin is warm and dry. No bruising and no ecchymosis noted. No cyanosis. No pallor.  Psychiatric: Her speech is normal and behavior is normal. Thought content  normal. Her mood appears not anxious. She does not exhibit a depressed mood.   Diabetic Foot Form - Detailed   Diabetic Foot Exam - detailed Diabetic Foot exam was performed with the following findings:  Yes 10/04/2017  9:37 AM  Visual Foot Exam completed.:  Yes  Pulse Foot Exam completed.:  Yes  Right Dorsalis Pedis:  Present Left Dorsalis Pedis:  Present  Sensory Foot Exam Completed.:  Yes Semmes-Weinstein Monofilament Test R Site 1-Great Toe:  Pos L Site 1-Great Toe:  Pos        Assessment/Plan:   Problem List Items Addressed This Visit      Other   Preventative health care - Primary    USPSTF grade A and B recommendations reviewed with patient; age-appropriate recommendations, preventive care, screening tests, etc discussed and encouraged; healthy living encouraged; see AVS for patient education given to patient        Other Visit Diagnoses    Screen for colon cancer       going to have colonoscopy soon   Cervical cancer screening       pap collected today with HPV co-testing   Relevant Orders   Cytology - PAP       Meds ordered this encounter  Medications  . sertraline (ZOLOFT) 100 MG tablet    Sig: Take 1.5 tablets (150 mg total) by mouth daily.    Dispense:  45 tablet    Refill:  5    Increasing the dose   No orders of the defined types were placed in this encounter.   Follow up plan: Return in about 1 year (around 10/05/2018) for complete physical.  An After Visit Summary was printed and given to the patient.

## 2017-10-06 LAB — CYTOLOGY - PAP
Diagnosis: NEGATIVE
HPV: NOT DETECTED

## 2017-10-11 ENCOUNTER — Encounter: Payer: Self-pay | Admitting: Gastroenterology

## 2017-10-11 ENCOUNTER — Ambulatory Visit: Payer: 59 | Admitting: Gastroenterology

## 2017-10-11 ENCOUNTER — Other Ambulatory Visit: Payer: Self-pay

## 2017-10-11 VITALS — BP 108/66 | HR 85 | Ht 69.0 in | Wt 200.0 lb

## 2017-10-11 DIAGNOSIS — R634 Abnormal weight loss: Secondary | ICD-10-CM | POA: Diagnosis not present

## 2017-10-11 DIAGNOSIS — R197 Diarrhea, unspecified: Secondary | ICD-10-CM

## 2017-10-11 DIAGNOSIS — Z1211 Encounter for screening for malignant neoplasm of colon: Secondary | ICD-10-CM

## 2017-10-11 NOTE — Progress Notes (Signed)
Gastroenterology Consultation  Referring Provider:     Arnetha Courser, MD Primary Care Physician:  Arnetha Courser, MD Primary Gastroenterologist:  Dr. Wickliffe Norris     Reason for Consultation:     Diarrhea        HPI:   Breanna Rogers is a 52 y.o. y/o female referred for consultation & management of diarrhea by Dr. Sanda Klein, Satira Anis, MD.  The patient comes today after having diarrhea and weight loss.  The patient reports that she was having profuse diarrhea with episodes of incontinence and diarrhea waking her up from sleep.  The patient reports that the symptoms had resolved after she stopped her diabetes medication.  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  She also reports that she was not eating very much which she states because her 30 pound weight loss after having neck surgery for disc fusion.  The patient has also had an increased eosinophilia on her recent CBC. She now reports that she is back to her baseline without any further weight loss and she states it has stabilized.  She is also back to her normal bowel movements with diarrhea approximately once a week.  Past Medical History:  Diagnosis Date  . Abdominal aortic atherosclerosis ([place]) 09/19/2017  . Diabetes mellitus type 2 with retinopathy ([place])   . Elevated transaminase level   . Hot flashes   . Hyperlipidemia   . 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[place]) 4/15, 10/15   seen every 6 months  . Numbness of foot   . Obesity   . Pancreatitis   . Spasm of muscle   . Vitamin D deficiency     Past Surgical History:  Procedure Laterality Date  . CERVICAL FUSION  2013   C5-7 at [facility], (ACDF C5-6 with removal of hardware)  . CERVICAL FUSION  07/23/2017   C3-C7  . TONSILLECTOMY AND ADENOIDECTOMY  1976  . TUBAL LIGATION  1990    Prior to  Admission medications   Medication Sig Start Date End Date Taking? Authorizing Provider  aspirin EC 81 MG tablet Take 81 mg by mouth daily.    [provider]  atorvastatin (LIPITOR) 40 MG tablet Take 1 tablet (40 mg total) by mouth at bedtime. 09/12/17   Arnetha Courser, MD  b complex vitamins tablet Take 1 tablet by mouth daily. 06/21/16   Arnetha Courser, MD  blood glucose meter kit and supplies KIT E11.329; LON 99 months; check FSBS three times a day while on insulin, then once a day off insulin 07/08/15   Lada, Satira Anis, MD  cetirizine (ZYRTEC) 10 MG tablet Take 1 tablet (10 mg total) by mouth daily as needed. 09/11/17   Lada, Satira Anis, MD  diazepam (VALIUM) 10 MG tablet Take 0.5-1 tablets (5-10 mg total) by mouth every 8 (eight) hours as needed. 04/28/17   Lada, Satira Anis, MD  lisinopril (PRINIVIL,ZESTRIL) 5 MG tablet TAKE 1 TABLET BY MOUTH ONCE DAILY. 06/06/17   Arnetha Courser, MD  Magnesium 250 MG TABS Take 250 mg by mouth daily. Take two caps daily    [provider]  metFORMIN (GLUCOPHAGE-XR) 500 MG 24 hr tablet Take 2 tablets (1,000 mg total) by mouth daily with breakfast. 09/11/17   Lada, Satira Anis, MD  Multiple Vitamin tablet Take 1 tablet by  mouth daily. 06/21/16   Lada, Satira Anis, MD  ONE TOUCH ULTRA TEST test strip USE TO CHECK BLOOD SUGAR THREE TIMES DAILY WHILE ON INSULIN AND EVERY DAY WHILE OFF INSULIN 08/28/15   Lada, Satira Anis, MD  ONETOUCH DELICA LANCETS FINE MISC USE TO CHECK BLOOD SUGAR THREE TIMES DAILY WHILE ON INSULIN AND EVERY DAY WHILE OFF INSULIN 07/27/15   Lada, Satira Anis, MD  sertraline (ZOLOFT) 100 MG tablet Take 1.5 tablets (150 mg total) by mouth daily. 10/04/17   Arnetha Courser, MD    Family History  Problem Relation Age of Onset  . Cancer Mother        cervical  . Asthma Mother   . Diabetes Mother   . Heart disease Mother   . Hyperlipidemia Mother   . Heart disease Father   . Hyperlipidemia Father   . Hypertension Father   . Heart attack Father    . Thyroid disease Sister   . Cancer Sister   . Diabetes Brother   . Hypertension Brother   . Diabetes Sister   . Heart attack Paternal Grandfather   . Diabetes Sister   . Heart disease Sister   . Heart attack Sister   . COPD Sister   . Stroke Neg Hx   . Breast cancer Neg Hx      Social History   Tobacco Use  . Smoking status: Former Smoker    Packs/day: 1.00    Years: 35.00    Pack years: 35.00    Types: Cigarettes    Last attempt to quit: 03/28/2010    Years since quitting: 7.5  . Smokeless tobacco: Never Used  Substance Use Topics  . Alcohol use: No  . Drug use: No    Allergies as of 10/11/2017 - Review Complete 10/04/2017  Allergen Reaction Noted  . Sulfa antibiotics  07/16/2014  . Trulicity [dulaglutide]  10/04/2017    Review of Systems:    All systems reviewed and negative except where noted in HPI.   Physical Exam:  There were no vitals taken for this visit. No LMP recorded. Patient is postmenopausal. General:   Alert,  Well-developed, well-nourished, pleasant and cooperative in NAD Head:  Normocephalic and atraumatic. Eyes:  Sclera clear, no icterus.   Conjunctiva pink. Ears:  Normal auditory acuity. Nose:  No deformity, discharge, or lesions. Mouth:  No deformity or lesions,oropharynx pink & moist. Neck:  Supple; no masses or thyromegaly. Lungs:  Respirations even and unlabored.  Clear throughout to auscultation.   No wheezes, crackles, or rhonchi. No acute distress. Heart:  Regular rate and rhythm; no murmurs, clicks, rubs, or gallops. Abdomen:  Normal bowel sounds.  No bruits.  Soft, non-tender and non-distended without masses, hepatosplenomegaly or hernias noted.  No guarding or rebound tenderness.  Negative Carnett sign.   Rectal:  Deferred.  Msk:  Symmetrical without gross deformities.  Good, equal movement & strength bilaterally. Pulses:  Normal pulses noted. Extremities:  No clubbing or edema.  No cyanosis. Neurologic:  Alert and oriented x3;   grossly normal neurologically. Skin:  Intact without significant lesions or rashes.  No jaundice. Lymph Nodes:  No significant cervical adenopathy. Psych:  Alert and cooperative. Normal mood and affect.  Imaging Studies: Ct Abdomen Pelvis W Contrast  Result Date: 09/19/2017 CLINICAL DATA:  Unintended weight loss, diarrhea for a month EXAM: CT ABDOMEN AND PELVIS WITH CONTRAST TECHNIQUE: Multidetector CT imaging of the abdomen and pelvis was performed using the standard protocol following bolus administration of  intravenous contrast. CONTRAST:  116m ISOVUE-300 IOPAMIDOL (ISOVUE-300) INJECTION 61% COMPARISON:  CT abdomen pelvis of 06/06/2016 FINDINGS: Lower chest: Lung bases are clear other than minimal scarring at the left lung base. No effusion is seen. As noted previously there is some thickening of the mucosa of the distal esophagus which may be due to mild esophagitis or possibly a small hernia. Hepatobiliary: The liver enhances with no focal abnormality and no ductal dilatation is seen. No calcified gallstones are noted. Pancreas: The pancreas appears normal in caliber and the peripancreatic fat planes are well preserved. There is no present evidence pancreatitis. Spleen: The spleen is unremarkable. Adrenals/Urinary Tract: The adrenal glands appear normal. The kidneys enhance and there is no evidence of renal calculus. The pelvocaliceal systems are unremarkable. The ureters appear normal caliber. The urinary bladder is well distended with no abnormality noted Stomach/Bowel: The stomach is partially distended with oral contrast and no abnormality is seen. No abnormality of the small bowel is noted. There are few right-sided colonic diverticula present. The terminal ileum and the appendix are well visualized with no abnormality noted. Vascular/Lymphatic: The abdominal aorta is normal in caliber with moderate abdominal aortic atherosclerosis present. No adenopathy is seen. Reproductive: The uterus is normal  in size. No adnexal lesion is seen. No fluid is noted within the pelvis. Other: No abdominal wall hernia is noted. 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 Normal alignment of the lumbar vertebrae remains with normal intervertebral disc spaces. IMPRESSION: 1. No explanation for the patient's weight loss or diarrhea is seen. No mucosal edema or mass is evident. There are a few right-sided colonic diverticula present. 2. No change in somewhat thickened mucosa of the distal esophagus possibly due to small hiatal hernia or mild esophagitis. 3. No renal or ureteral calculi are seen. 4. Moderate abdominal aortic atherosclerosis. Electronically Signed   By: PIvar DrapeM.D.   On: 09/19/2017 11:40    Assessment and Plan:   LLIVIA TARRis a 52y.o. y/o female who comes in today with a history of a 30 pound weight loss and diarrhea.  The patient's weight loss and diarrhea has been attributed to both her neck surgery which left her with some dysphasia that has resolved and her Trulicity that was used for her diabetes which caused her to have diarrhea.  Since that medication has been stopped her diarrhea has gone back to approximately once a week and her weight loss has stabilized.  The patient is 52years old and has not had a screening colonoscopy and will be set up for a screening colonoscopy. I have discussed risks & benefits which include, but are not limited to, bleeding, infection, perforation & drug reaction.  The patient agrees with this plan & written consent will be obtained.     DLucilla Lame MD. FMarval Regal   Note: This dictation was prepared with Dragon dictation along with smaller phrase technology. Any transcriptional errors that result from this process are unintentional.

## 2017-10-31 ENCOUNTER — Telehealth: Payer: Self-pay

## 2017-10-31 NOTE — Telephone Encounter (Signed)
Patient contacted office to reschedule her colonoscopy she has been rescheduled to 09/20.  Thanks [employer]

## 2017-11-10 ENCOUNTER — Telehealth: Payer: Self-pay | Admitting: Family Medicine

## 2017-11-10 DIAGNOSIS — R921 Mammographic calcification found on diagnostic imaging of breast: Secondary | ICD-10-CM

## 2017-11-10 NOTE — Telephone Encounter (Signed)
Please let patient know that it's time for LEFT breast imaging ------------------------------------------------------ From May 26, 2017 RECOMMENDATION: LEFT diagnostic mammogram is recommended in 6 months. ------------------------------------------------------ Help her schedule that please

## 2017-11-13 ENCOUNTER — Other Ambulatory Visit: Payer: Self-pay | Admitting: Family Medicine

## 2017-11-13 MED ORDER — LISINOPRIL 5 MG PO TABS: 5.0000 mg | ORAL_TABLET | Freq: Every day | ORAL | 0 refills | Status: DC

## 2017-11-13 NOTE — Telephone Encounter (Signed)
Pt.notified

## 2017-12-06 ENCOUNTER — Encounter: Payer: Self-pay | Admitting: *Deleted

## 2017-12-06 ENCOUNTER — Other Ambulatory Visit: Payer: Self-pay

## 2017-12-12 ENCOUNTER — Ambulatory Visit: Payer: 59 | Admitting: Family Medicine

## 2017-12-14 NOTE — Discharge Instructions (Signed)
General Anesthesia, Adult, Care After °These instructions provide you with information about caring for yourself after your procedure. Your health care provider may also give you more specific instructions. Your treatment has been planned according to current medical practices, but problems sometimes occur. Call your health care provider if you have any problems or questions after your procedure. °What can I expect after the procedure? °After the procedure, it is common to have: °· Vomiting. °· A sore throat. °· Mental slowness. ° °It is common to feel: °· Nauseous. °· Cold or shivery. °· Sleepy. °· Tired. °· Sore or achy, even in parts of your body where you did not have surgery. ° °Follow these instructions at home: °For at least 24 hours after the procedure: °· Do not: °? Participate in activities where you could fall or become injured. °? Drive. °? Use heavy machinery. °? Drink alcohol. °? Take sleeping pills or medicines that cause drowsiness. °? Make important decisions or sign legal documents. °? Take care of children on your own. °· Rest. °Eating and drinking °· If you vomit, drink water, juice, or soup when you can drink without vomiting. °· Drink enough fluid to keep your urine clear or pale yellow. °· Make sure you have little or no nausea before eating solid foods. °· Follow the diet recommended by your health care provider. °General instructions °· Have a responsible adult stay with you until you are awake and alert. °· Return to your normal activities as told by your health care provider. Ask your health care provider what activities are safe for you. °· Take over-the-counter and prescription medicines only as told by your health care provider. °· If you smoke, do not smoke without supervision. °· Keep all follow-up visits as told by your health care provider. This is important. °Contact a health care provider if: °· You continue to have nausea or vomiting at home, and medicines are not helpful. °· You  cannot drink fluids or start eating again. °· You cannot urinate after 8-12 hours. °· You develop a skin rash. °· You have fever. °· You have increasing redness at the site of your procedure. °Get help right away if: °· You have difficulty breathing. °· You have chest pain. °· You have unexpected bleeding. °· You feel that you are having a life-threatening or urgent problem. °This information is not intended to replace advice given to you by your health care provider. Make sure you discuss any questions you have with your health care provider. °Document Released: 06/20/2000 Document Revised: 08/17/2015 Document Reviewed: 02/26/2015 °Elsevier Interactive Patient Education © 2018 [employer] ° °

## 2017-12-15 ENCOUNTER — Ambulatory Visit
Admission: RE | Admit: 2017-12-15 | Discharge: 2017-12-15 | Disposition: A | Discharge status: Home / Self Care | Payer: 59 | Source: Ambulatory Visit | Attending: Gastroenterology | Admitting: Gastroenterology

## 2017-12-15 ENCOUNTER — Ambulatory Visit: Payer: 59 | Admitting: Anesthesiology

## 2017-12-15 ENCOUNTER — Encounter
Admission: RE | Disposition: A | Discharge status: Home / Self Care | Payer: Self-pay | Source: Ambulatory Visit | Attending: Gastroenterology

## 2017-12-15 DIAGNOSIS — E785 Hyperlipidemia, unspecified: Secondary | ICD-10-CM | POA: Insufficient documentation

## 2017-12-15 DIAGNOSIS — Z833 Family history of diabetes mellitus: Secondary | ICD-10-CM | POA: Insufficient documentation

## 2017-12-15 DIAGNOSIS — Z1211 Encounter for screening for malignant neoplasm of colon: Secondary | ICD-10-CM

## 2017-12-15 DIAGNOSIS — Z7982 Long term (current) use of aspirin: Secondary | ICD-10-CM | POA: Diagnosis not present

## 2017-12-15 DIAGNOSIS — E11319 Type 2 diabetes mellitus with unspecified diabetic retinopathy without macular edema: Secondary | ICD-10-CM | POA: Diagnosis not present

## 2017-12-15 DIAGNOSIS — Z87891 Personal history of nicotine dependence: Secondary | ICD-10-CM | POA: Insufficient documentation

## 2017-12-15 DIAGNOSIS — D125 Benign neoplasm of sigmoid colon: Secondary | ICD-10-CM | POA: Diagnosis not present

## 2017-12-15 DIAGNOSIS — K635 Polyp of colon: Secondary | ICD-10-CM

## 2017-12-15 DIAGNOSIS — Z794 Long term (current) use of insulin: Secondary | ICD-10-CM | POA: Diagnosis not present

## 2017-12-15 HISTORY — PX: POLYPECTOMY: SHX5525

## 2017-12-15 HISTORY — PX: COLONOSCOPY WITH PROPOFOL: SHX5780

## 2017-12-15 LAB — GLUCOSE, CAPILLARY
GLUCOSE-CAPILLARY: 109 mg/dL — AB (ref 70–99)
Glucose-Capillary: 118 mg/dL — ABNORMAL HIGH (ref 70–99)

## 2017-12-15 SURGERY — COLONOSCOPY WITH PROPOFOL
Anesthesia: General | Site: Rectum

## 2017-12-15 MED ORDER — LIDOCAINE HCL (CARDIAC) PF 100 MG/5ML IV SOSY
PREFILLED_SYRINGE | INTRAVENOUS | Status: DC | PRN
  Administered 2017-12-15: 30 mg via INTRAVENOUS

## 2017-12-15 MED ORDER — SODIUM CHLORIDE 0.9 % IV SOLN: INTRAVENOUS | Status: DC

## 2017-12-15 MED ORDER — PROPOFOL 10 MG/ML IV BOLUS
INTRAVENOUS | Status: DC | PRN
  Administered 2017-12-15 (×2): 40 mg via INTRAVENOUS
  Administered 2017-12-15: 100 mg via INTRAVENOUS
  Administered 2017-12-15 (×2): 40 mg via INTRAVENOUS

## 2017-12-15 MED ORDER — STERILE WATER FOR IRRIGATION IR SOLN
Status: DC | PRN
  Administered 2017-12-15: 08:00:00

## 2017-12-15 MED ORDER — LACTATED RINGERS IV SOLN
1000.0000 mL | INTRAVENOUS | Status: DC
  Administered 2017-12-15: 1000 mL via INTRAVENOUS

## 2017-12-15 SURGICAL SUPPLY — 6 items
CANISTER SUCT 1200ML W/VALVE (MISCELLANEOUS) ×2 IMPLANT
FORCEPS BIOP RAD 4 LRG CAP 4 (CUTTING FORCEPS) ×1 IMPLANT
GOWN CVR UNV OPN BCK APRN NK (MISCELLANEOUS) ×2 IMPLANT
GOWN ISOL THUMB LOOP REG UNIV (MISCELLANEOUS) ×4
KIT ENDO PROCEDURE OLY (KITS) ×2 IMPLANT
WATER STERILE IRR 250ML POUR (IV SOLUTION) ×2 IMPLANT

## 2017-12-15 NOTE — Anesthesia Postprocedure Evaluation (Signed)
Anesthesia Post Note  Patient: Breanna Rogers  Procedure(s) Performed: COLONOSCOPY WITH PROPOFOL with biopsy (N/A Rectum) POLYPECTOMY (N/A Rectum)  Patient location during evaluation: PACU Anesthesia Type: General Level of consciousness: awake Pain management: pain level controlled Vital Signs Assessment: post-procedure vital signs reviewed and stable Respiratory status: spontaneous breathing Cardiovascular status: blood pressure returned to baseline Postop Assessment: no headache Anesthetic complications: no    Lavonna Monarch

## 2017-12-15 NOTE — Op Note (Signed)
[facility] Gastroenterology Patient Name: Breanna Rogers Procedure Date: 12/15/2017 7:44 AM MRN: [id number] Account #: [id number] Date of Birth: 08/19/1965 Admit Type: Outpatient Age: 52 Room: [facility] OR ROOM 01 Gender: Female Note Status: Finalized Procedure:            Colonoscopy Indications:          Screening for colorectal malignant neoplasm Providers:            Lucilla Lame MD, MD Referring MD:         Arnetha Courser (Referring MD) Medicines:            Propofol per Anesthesia Complications:        No immediate complications. Procedure:            Pre-Anesthesia Assessment:                       - Prior to the procedure, a History and Physical was                        performed, and patient medications and allergies were                        reviewed. The patient's tolerance of previous                        anesthesia was also reviewed. The risks and benefits of                        the procedure and the sedation options and risks were                        discussed with the patient. All questions were                        answered, and informed consent was obtained. Prior                        Anticoagulants: The patient has taken no previous                        anticoagulant or antiplatelet agents. ASA Grade                        Assessment: II - A patient with mild systemic disease.                        After reviewing the risks and benefits, the patient was                        deemed in satisfactory condition to undergo the                        procedure.                       After obtaining informed consent, the colonoscope was                        passed under direct vision. Throughout the procedure,  the patient's blood pressure, pulse, and oxygen                        saturations were monitored continuously. The was                        introduced through the anus and advanced to the the            cecum, identified by appendiceal orifice and ileocecal                        valve. The colonoscopy was performed without                        difficulty. The patient tolerated the procedure well.                        The quality of the bowel preparation was excellent. Findings:      The perianal and digital rectal examinations were normal.      A 3 mm polyp was found in the sigmoid colon. The polyp was sessile. The       polyp was removed with a cold biopsy forceps. Resection and retrieval       were complete.      The exam was otherwise without abnormality. Impression:           - One 3 mm polyp in the sigmoid colon, removed with a                        cold biopsy forceps. Resected and retrieved.                       - The examination was otherwise normal. Recommendation:       - Discharge patient to home.                       - Resume previous diet.                       - Continue present medications.                       - Await pathology results.                       - Repeat colonoscopy in 5 years if polyp adenoma and 10                        years if hyperplastic Procedure Code(s):    --- Professional ---                       [id number], Colonoscopy, flexible; with biopsy, single or                        multiple Diagnosis Code(s):    --- Professional ---                       Z12.11, Encounter for screening for malignant neoplasm                        of colon  D12.5, Benign neoplasm of sigmoid colon CPT copyright 2017 American Medical Association. All rights reserved. The codes documented in this report are preliminary and upon coder review may  be revised to meet current compliance requirements. Lucilla Lame MD, MD 12/15/2017 8:21:31 AM This report has been signed electronically. Number of Addenda: 0 Note Initiated On: 12/15/2017 7:44 AM Scope Withdrawal Time: 0 hours 7 minutes 49 seconds  Total Procedure Duration: 0 hours 12 minutes  46 seconds       [facility]

## 2017-12-15 NOTE — Transfer of Care (Signed)
Immediate Anesthesia Transfer of Care Note  Patient: Breanna Rogers  Procedure(s) Performed: COLONOSCOPY WITH PROPOFOL with biopsy (N/A Rectum) POLYPECTOMY (N/A Rectum)  Patient Location: PACU  Anesthesia Type: General  Level of Consciousness: awake, alert  and patient cooperative  Airway and Oxygen Therapy: Patient Spontanous Breathing and Patient connected to supplemental oxygen  Post-op Assessment: Post-op Vital signs reviewed, Patient's Cardiovascular Status Stable, Respiratory Function Stable, Patent Airway and No signs of Nausea or vomiting  Post-op Vital Signs: Reviewed and stable  Complications: No apparent anesthesia complications

## 2017-12-15 NOTE — Anesthesia Preprocedure Evaluation (Signed)
Anesthesia Evaluation  Patient identified by MRN, date of birth, ID band Patient awake    Reviewed: Allergy & Precautions, NPO status , Patient's Chart, lab work & pertinent test results, reviewed documented beta blocker date and time   Airway Mallampati: II  TM Distance: >3 FB Neck ROM: Full    Dental no notable dental hx.    Pulmonary former smoker,    Pulmonary exam normal breath sounds clear to auscultation       Cardiovascular + Peripheral Vascular Disease (Aortic atherosclerosis)  Normal cardiovascular exam Rhythm:Regular Rate:Normal     Neuro/Psych PSYCHIATRIC DISORDERS Anxiety Depression  Neuromuscular disease    GI/Hepatic negative GI ROS, Neg liver ROS,   Endo/Other  diabetesWith retinopathy  Renal/GU negative Renal ROS     Musculoskeletal  (+) Arthritis ,   Abdominal Normal abdominal exam  (+)   Peds  Hematology negative hematology ROS (+)   Anesthesia Other Findings   Reproductive/Obstetrics                             Anesthesia Physical Anesthesia Plan  ASA: II  Anesthesia Plan: General   Post-op Pain Management:    Induction: Intravenous  PONV Risk Score and Plan:   Airway Management Planned: Natural Airway  Additional Equipment: None  Intra-op Plan:   Post-operative Plan:   Informed Consent: I have reviewed the patients History and Physical, chart, labs and discussed the procedure including the risks, benefits and alternatives for the proposed anesthesia with the patient or authorized representative who has indicated his/her understanding and acceptance.     Plan Discussed with: CRNA, Anesthesiologist and Surgeon  Anesthesia Plan Comments:         Anesthesia Quick Evaluation

## 2017-12-15 NOTE — H&P (Signed)
 Darren Wohl, MD FACG [address] [place], [place] [address] Phone: [phone] Fax : [phone]  Primary Care Physician:  Lada, Melinda P, MD Primary Gastroenterologist:  Dr. Wohl  Pre-Procedure History & Physical: HPI:  Breanna Rogers is a 52 y.o. female is here for a screening colonoscopy.   Past Medical History:  Diagnosis Date  . Abdominal aortic atherosclerosis (HCC) 09/19/2017  . Diabetes mellitus type 2 with retinopathy (HCC)   . Elevated transaminase level   . Hot flashes   . Hyperlipidemia   . 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HCC) 4/15, 10/15   seen every 6 months  . Numbness of foot   . Obesity   . Pancreatitis   . Spasm of muscle   . Vitamin D deficiency     Past Surgical History:  Procedure Laterality Date  . CERVICAL FUSION  2013   C5-7 at [facility], (ACDF C5-6 with removal of hardware)  . CERVICAL FUSION  07/23/2017   C3-C7  . TONSILLECTOMY AND ADENOIDECTOMY  1976  . TUBAL LIGATION  1990    Prior to Admission medications   Medication Sig Start Date End Date Taking? Authorizing Provider  aspirin EC 81 MG tablet Take 81 mg by mouth daily.   Yes [provider]  atorvastatin (LIPITOR) 40 MG tablet Take 1 tablet (40 mg total) by mouth at bedtime. 09/12/17  Yes Lada, Melinda P, MD  blood glucose meter kit and supplies KIT E11.329; LON 99 months; check FSBS three times a day while on insulin, then once a day off insulin 07/08/15  Yes Lada, Melinda P, MD  cetirizine (ZYRTEC) 10 MG tablet Take 1 tablet (10 mg total) by mouth daily as needed. 09/11/17  Yes Lada, Melinda P, MD  lisinopril (PRINIVIL,ZESTRIL) 5 MG tablet Take 1 tablet (5 mg total) by mouth daily. 11/13/17  Yes Lada, Melinda P, MD  metFORMIN (GLUCOPHAGE-XR) 500 MG 24 hr tablet Take 2 tablets (1,000 mg total) by mouth daily with breakfast. 09/11/17  Yes Lada, Melinda P, MD  ONE TOUCH ULTRA TEST test strip USE TO CHECK BLOOD SUGAR THREE TIMES DAILY WHILE ON INSULIN AND EVERY DAY  WHILE OFF INSULIN 08/28/15  Yes Lada, Melinda P, MD  ONETOUCH DELICA LANCETS FINE MISC USE TO CHECK BLOOD SUGAR THREE TIMES DAILY WHILE ON INSULIN AND EVERY DAY WHILE OFF INSULIN 07/27/15  Yes Lada, Melinda P, MD  sertraline (ZOLOFT) 100 MG tablet Take 1.5 tablets (150 mg total) by mouth daily. 10/04/17  Yes Lada, Melinda P, MD  b complex vitamins tablet Take 1 tablet by mouth daily. 06/21/16   Lada, Melinda P, MD  diazepam (VALIUM) 10 MG tablet Take 0.5-1 tablets (5-10 mg total) by mouth every 8 (eight) hours as needed. Patient not taking: Reported on 12/06/2017 04/28/17   Lada, Melinda P, MD  Magnesium 250 MG TABS Take 250 mg by mouth daily. Take two caps daily    [provider]  Multiple Vitamin tablet Take 1 tablet by mouth daily. 06/21/16   Lada, Melinda P, MD    Allergies as of 10/11/2017 - Review Complete 10/11/2017  Allergen Reaction Noted  . Sulfa antibiotics  07/16/2014  . Trulicity [dulaglutide]  10/04/2017    Family History  Problem Relation Age of Onset  . Cancer Mother        cervical  . Asthma Mother   . Diabetes Mother   . Heart disease Mother   . Hyperlipidemia Mother   . Heart disease Father   .   Hyperlipidemia Father   . Hypertension Father   . Heart attack Father   . Thyroid disease Sister   . Cancer Sister   . Diabetes Brother   . Hypertension Brother   . Diabetes Sister   . Heart attack Paternal Grandfather   . Diabetes Sister   . Heart disease Sister   . Heart attack Sister   . COPD Sister   . Stroke Neg Hx   . Breast cancer Neg Hx     Social History   Socioeconomic History  . Marital status: Married    Spouse name: Not on file  . Number of children: Not on file  . Years of education: Not on file  . Highest education level: Not on file  Occupational History  . Not on file  Social Needs  . Financial resource strain: Not on file  . Food insecurity:    Worry: Not on file    Inability: Not on file  . Transportation needs:    Medical: Not on  file    Non-medical: Not on file  Tobacco Use  . Smoking status: Former Smoker    Packs/day: 1.00    Years: 35.00    Pack years: 35.00    Types: Cigarettes    Last attempt to quit: 03/28/2010    Years since quitting: 7.7  . Smokeless tobacco: Never Used  Substance and Sexual Activity  . Alcohol use: No  . Drug use: No  . Sexual activity: Yes  Lifestyle  . Physical activity:    Days per week: Not on file    Minutes per session: Not on file  . Stress: Not on file  Relationships  . Social connections:    Talks on phone: Not on file    Gets together: Not on file    Attends religious service: Not on file    Active member of club or organization: Not on file    Attends meetings of clubs or organizations: Not on file    Relationship status: Not on file  . Intimate partner violence:    Fear of current or ex partner: Not on file    Emotionally abused: Not on file    Physically abused: Not on file    Forced sexual activity: Not on file  Other Topics Concern  . Not on file  Social History Narrative  . Not on file    Review of Systems: See HPI, otherwise negative ROS  Physical Exam: BP 136/90   Pulse 83   Temp (!) 97.3 F (36.3 C) (Temporal)   Resp 16   Ht 5' 9" (1.753 m)   Wt 88 kg   SpO2 100%   BMI 28.65 kg/m  General:   Alert,  pleasant and cooperative in NAD Head:  Normocephalic and atraumatic. Neck:  Supple; no masses or thyromegaly. Lungs:  Clear throughout to auscultation.    Heart:  Regular rate and rhythm. Abdomen:  Soft, nontender and nondistended. Normal bowel sounds, without guarding, and without rebound.   Neurologic:  Alert and  oriented x4;  grossly normal neurologically.  Impression/Plan: Breanna Rogers is now here to undergo a screening colonoscopy.  Risks, benefits, and alternatives regarding colonoscopy have been reviewed with the patient.  Questions have been answered.  All parties agreeable.

## 2017-12-18 ENCOUNTER — Encounter: Payer: Self-pay | Admitting: Gastroenterology

## 2017-12-19 ENCOUNTER — Ambulatory Visit
Admission: RE | Admit: 2017-12-19 | Discharge: 2017-12-19 | Disposition: A | Discharge status: Home / Self Care | Payer: 59 | Source: Ambulatory Visit | Attending: Family Medicine | Admitting: Family Medicine

## 2017-12-19 DIAGNOSIS — R921 Mammographic calcification found on diagnostic imaging of breast: Secondary | ICD-10-CM | POA: Diagnosis present

## 2017-12-21 ENCOUNTER — Encounter: Payer: Self-pay | Admitting: Gastroenterology

## 2018-01-09 ENCOUNTER — Other Ambulatory Visit: Payer: Self-pay | Admitting: Family Medicine

## 2018-02-12 ENCOUNTER — Other Ambulatory Visit: Payer: Self-pay | Admitting: Family Medicine

## 2018-02-12 NOTE — Telephone Encounter (Signed)
Refill request for Hypertension medication:  Lisinopril 5 mg  Last office visit pertaining to hypertension: 09/11/2017  BP Readings from Last 3 Encounters:  12/15/17 119/73  10/11/17 108/66  10/04/17 116/62     Lab Results  Component Value Date   CREATININE 0.73 09/11/2017   BUN 11 09/11/2017   NA 141 09/11/2017   K 4.9 09/11/2017   CL 104 09/11/2017   CO2 27 09/11/2017   Follow-ups on file. 02/16/2018

## 2018-02-13 NOTE — Telephone Encounter (Signed)
Last Cr and K+ reviewed, Rx approved

## 2018-03-05 ENCOUNTER — Other Ambulatory Visit: Payer: Self-pay | Admitting: Family Medicine

## 2018-03-05 NOTE — Telephone Encounter (Signed)
Last Cr reviewed; Rxs approved 

## 2018-03-17 IMAGING — CT CT ABD-PELV W/ CM
2 of 5 series · 16 of 46 positions shown, 18 images · IV contrast (APPLIED)
Comparison: None.

CLINICAL DATA: Abdominal pain and nausea for 6 days, especially
postprandial.

EXAM:
CT ABDOMEN AND PELVIS WITH CONTRAST
TECHNIQUE: Multidetector CT imaging of the abdomen and pelvis was performed
using the standard protocol following bolus administration of
intravenous contrast.
CONTRAST:  100mL [id number] IOPAMIDOL ([id number]-[id number]) INJECTION 61%

[Series 2: routine abd/pel with · axial · 0.82mm/px · z∈[-783,-303]mm · 13 of 108 slices shown, 15 images]
[im 6/108  soft-tissue]
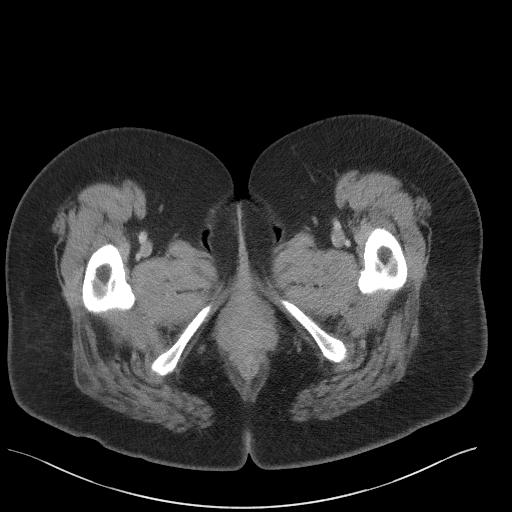
[im 6/108  bone]
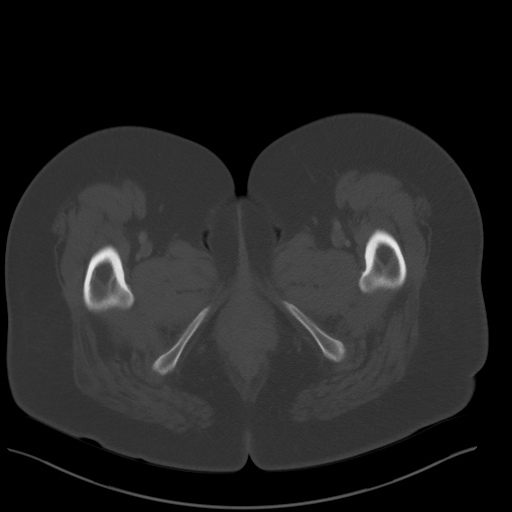
[im 17/108  soft-tissue]
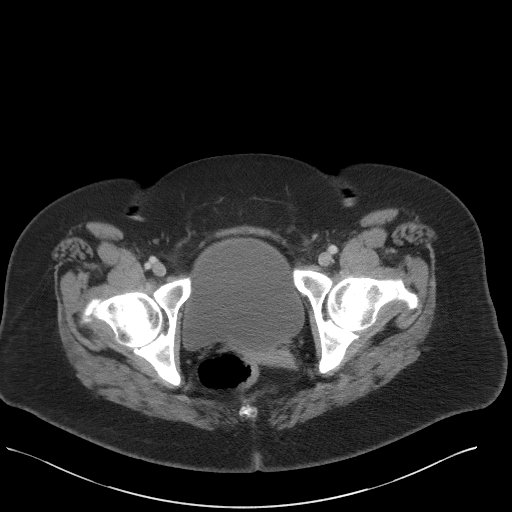
[im 23/108  soft-tissue]
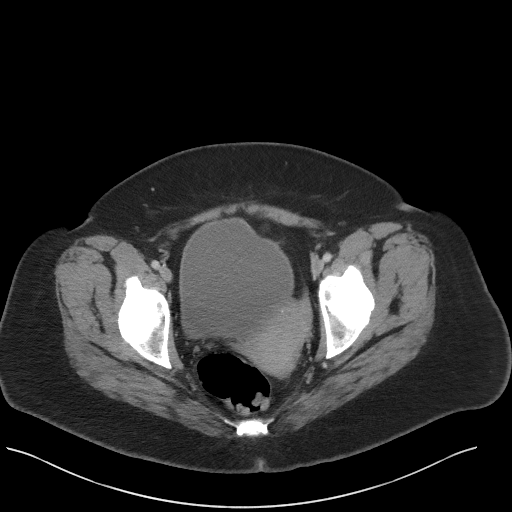
[im 29/108  soft-tissue]
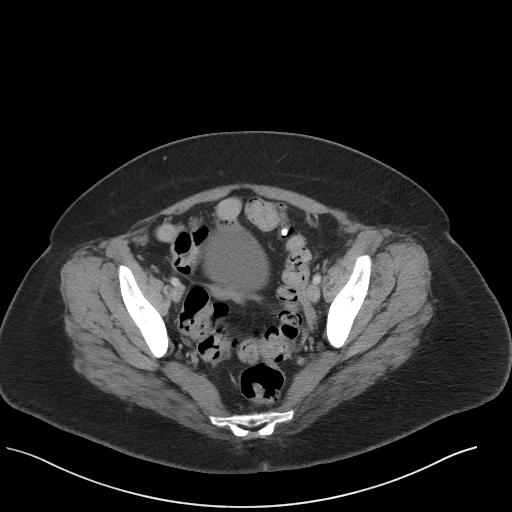
[im 40/108  soft-tissue]
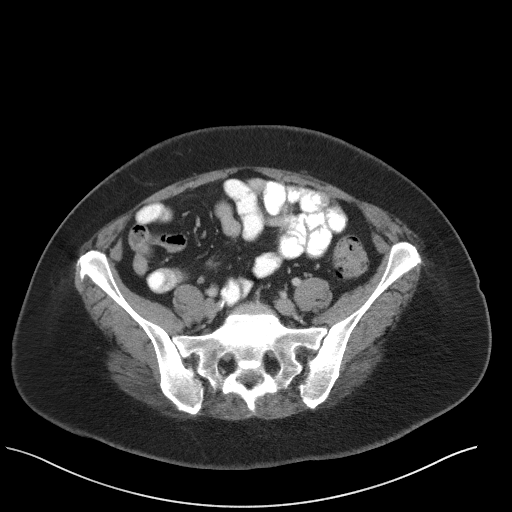
[im 46/108  soft-tissue]
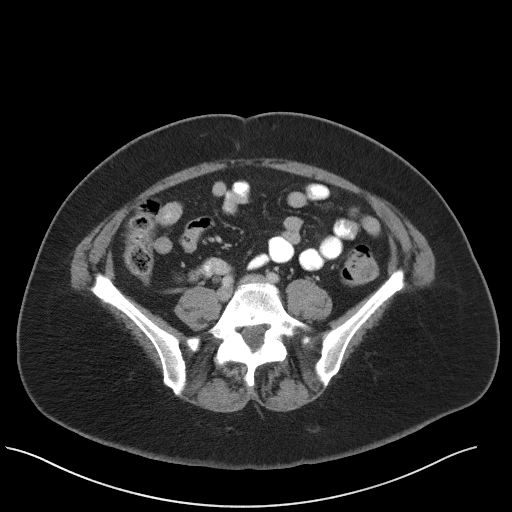
[im 57/108  soft-tissue]
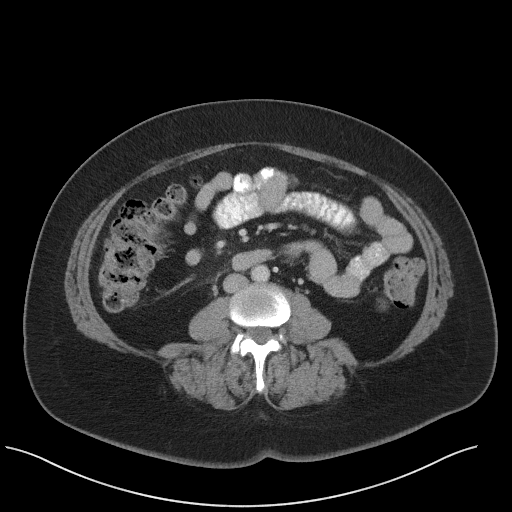
[im 62/108  soft-tissue]
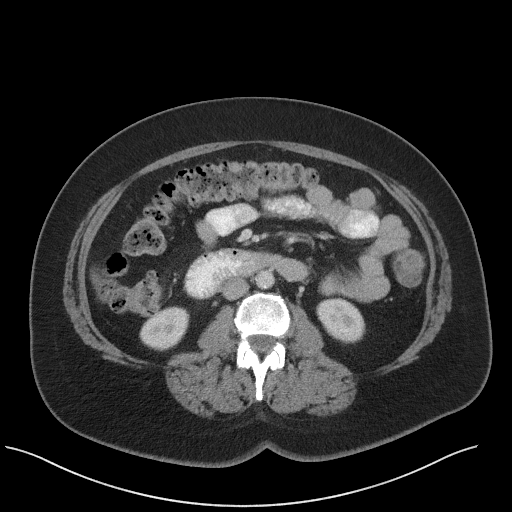
[im 68/108  soft-tissue]
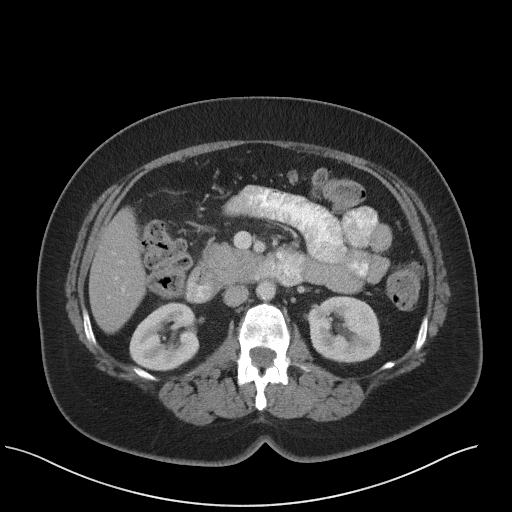
[im 68/108  bone]
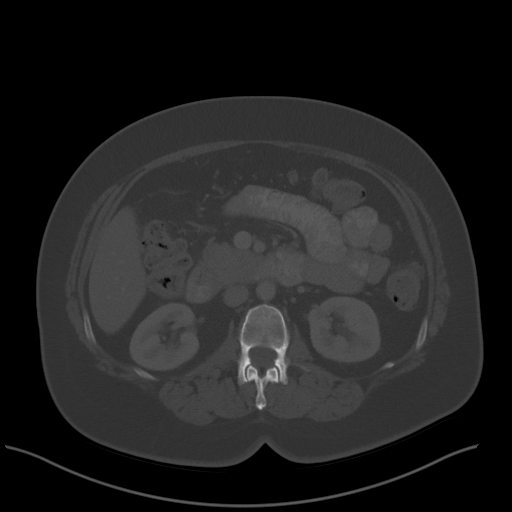
[im 79/108  soft-tissue]
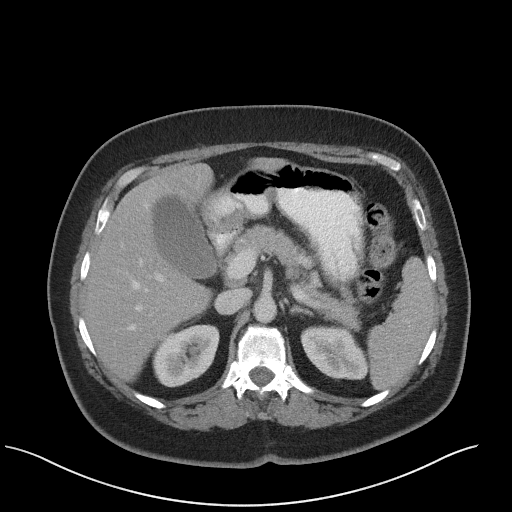
[im 85/108  soft-tissue]
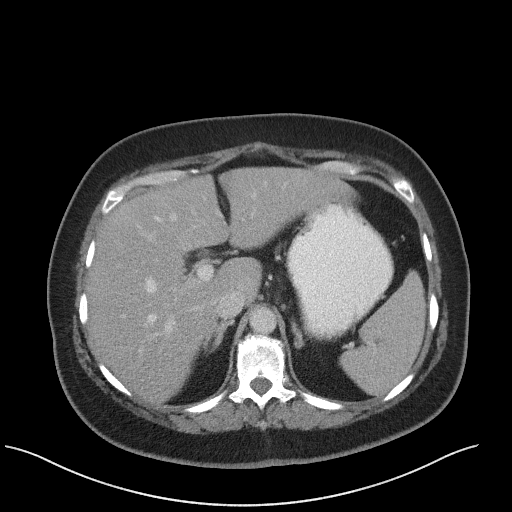
[im 91/108  soft-tissue]
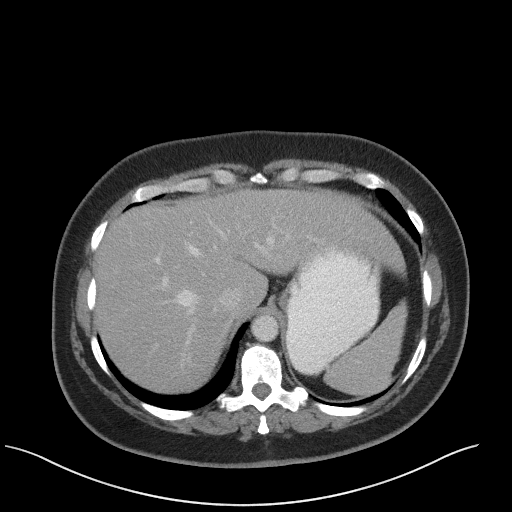
[im 102/108  soft-tissue]
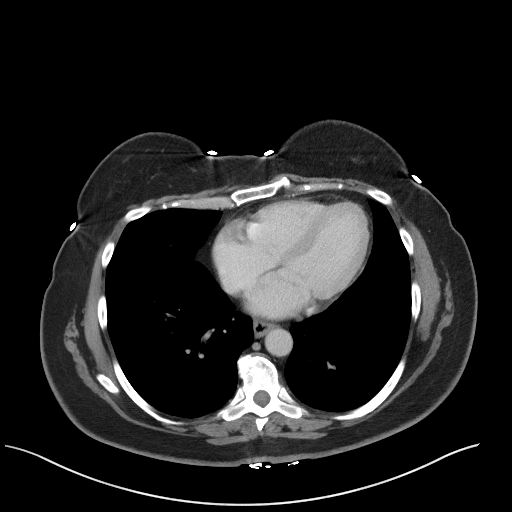

[Series 5: coronal st · coronal · 0.77mm/px · 3 of 95 slices shown]
[im 32/95  soft-tissue]
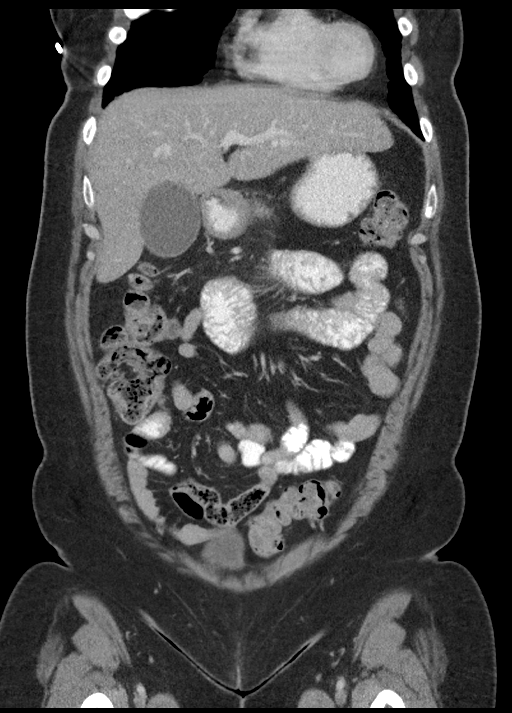
[im 42/95  soft-tissue]
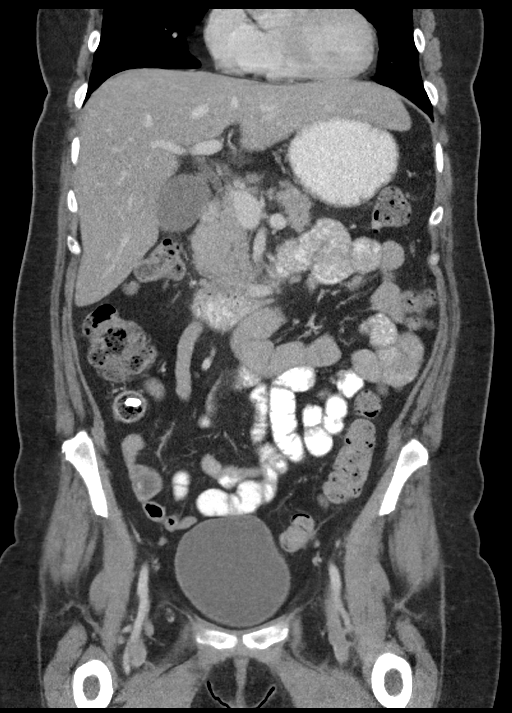
[im 53/95  soft-tissue]
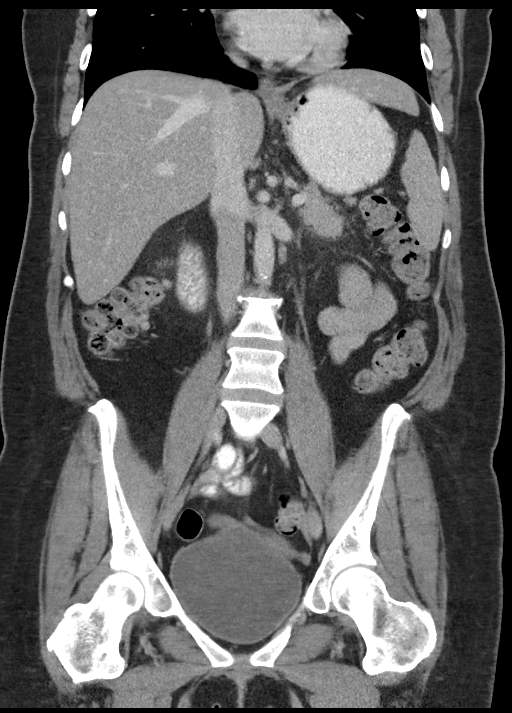

[16 of 46 positions shown; findings below may reference images not displayed]

FINDINGS: Lower chest: Borderline cardiomegaly. Mild distal esophageal wall
thickening.

Hepatobiliary: Unremarkable

Pancreas: Subtle peripancreatic stranding. No findings of
pseudocyst, abscess, or necrosis.

Spleen: Unremarkable

Stomach/Bowel: Upper normal amount of stool in the colon.

Vascular/Lymphatic: Aortoiliac atherosclerotic vascular disease.

Reproductive: Unremarkable

Other: No supplemental non-categorized findings.

Musculoskeletal: Chronic left unilateral pars defect at L5, with
sclerosis in the right pars region but no overt pars defect on the
right. No significant subluxation.
IMPRESSION: 1. Subtle peripancreatic stranding suggesting mild acute
pancreatitis. No findings of pseudocyst, abscess, or necrosis.
2. Borderline appearance for constipation with upper normal amount
of stool in the colon.
3.  Aortoiliac atherosclerotic vascular disease.
4. Chronic left unilateral pars defect at L5 without subluxation.
5. Mild distal esophageal wall thickening, possibly from
esophagitis.
6. Borderline cardiomegaly.

## 2018-03-18 IMAGING — DX DG CHEST 1V PORT
1 series · 1 of 1 positions shown · non-contrast
Comparison: 04/20/2012

CLINICAL DATA: Cough and fever

EXAM:
PORTABLE CHEST 1 VIEW

[chest ap]
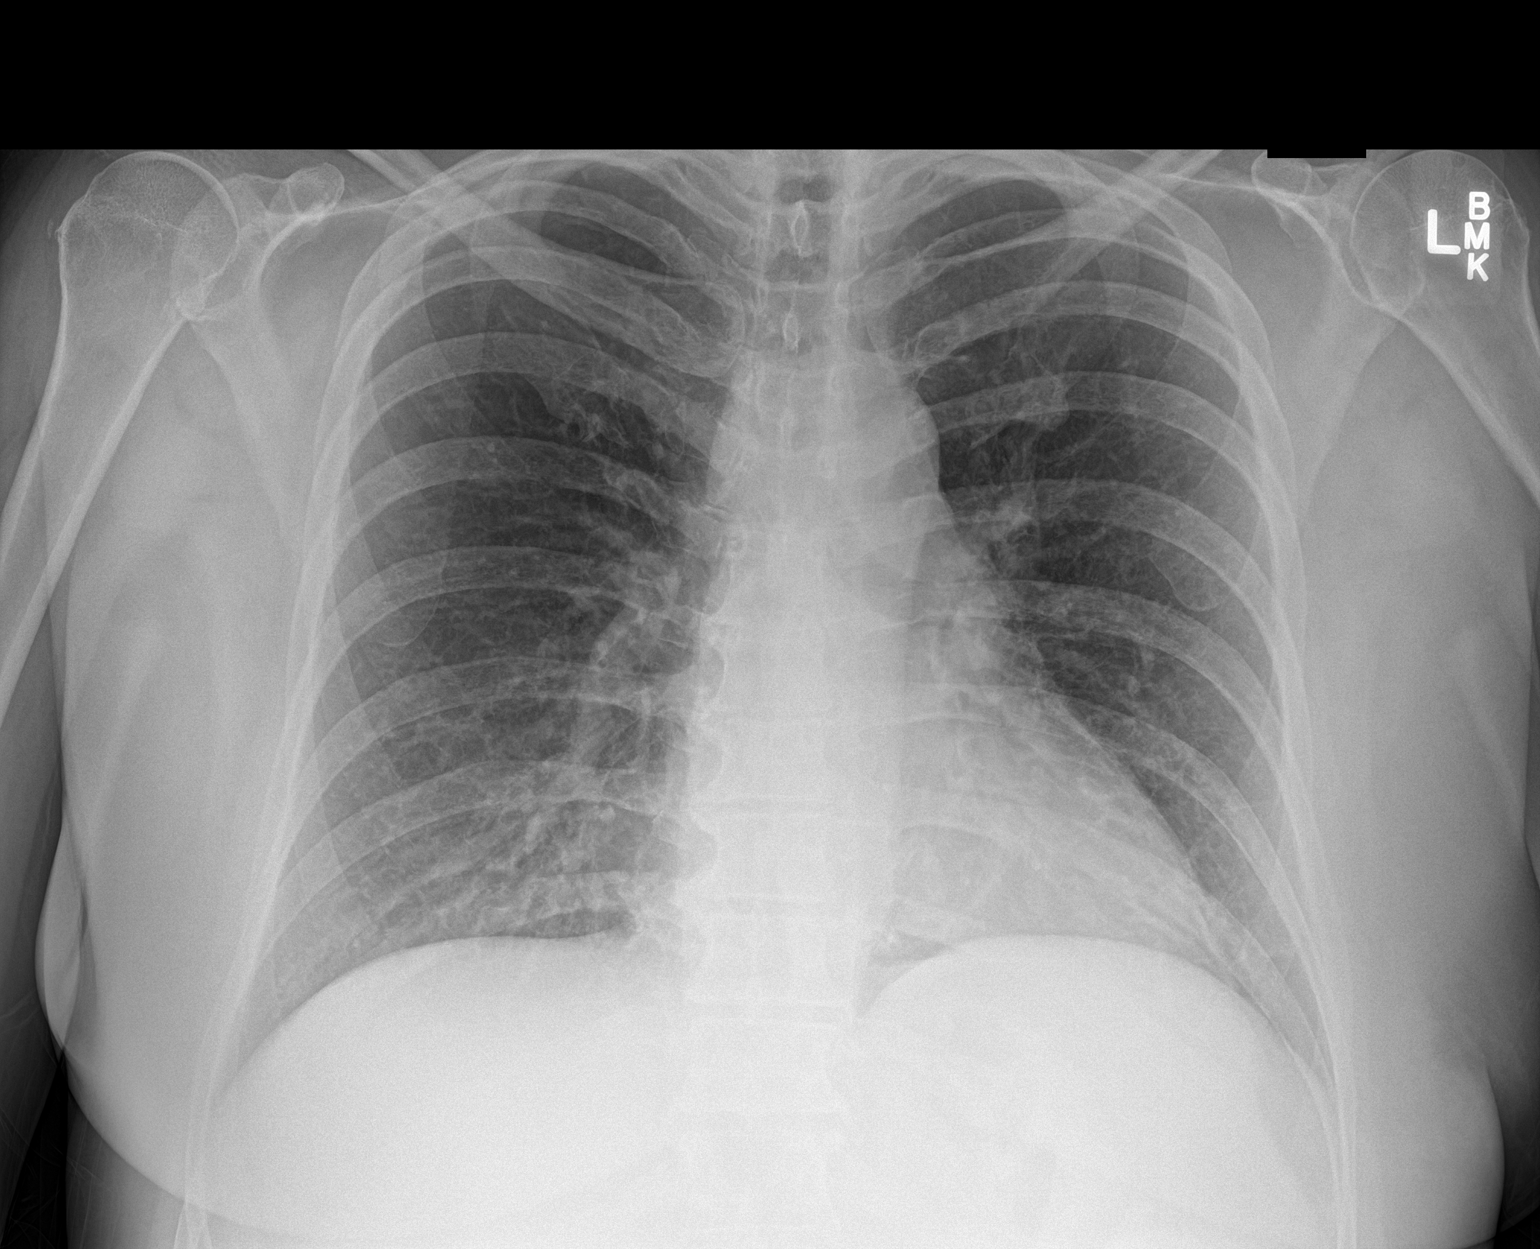

[1 of 1 positions shown; findings below may reference images not displayed]

FINDINGS: Lower cervical hardware. No acute infiltrate or effusion. Normal
cardiomediastinal silhouette. No pneumothorax.
IMPRESSION: No active disease.

## 2018-04-13 ENCOUNTER — Other Ambulatory Visit: Payer: Self-pay

## 2018-04-13 MED ORDER — SERTRALINE HCL 100 MG PO TABS: 150.0000 mg | ORAL_TABLET | Freq: Every day | ORAL | 1 refills | Status: DC

## 2018-04-13 NOTE — Telephone Encounter (Signed)
I changed the amount to a 90 day supply and sent new Rx

## 2018-04-13 NOTE — Telephone Encounter (Signed)
Message from insurance and pharmacy they will only pay for 90 day supply

## 2018-04-17 ENCOUNTER — Telehealth: Payer: Self-pay | Admitting: Family Medicine

## 2018-04-17 DIAGNOSIS — R921 Mammographic calcification found on diagnostic imaging of breast: Secondary | ICD-10-CM

## 2018-04-17 NOTE — Telephone Encounter (Signed)
-----   Message from Arnetha Courser, MD sent at 12/19/2017  2:41 PM EDT ----- Regarding: additional breast imaging due Feb 2020 RECOMMENDATION: Bilateral diagnostic mammogram recommended in February of 2020.

## 2018-04-17 NOTE — Telephone Encounter (Signed)
From Sept imaging: RECOMMENDATION: Bilateral diagnostic mammogram recommended in February of 2020. ---------------------------------------------------- Ordered  Please let patient know that she'll be due soon for breast imaging; help get scheduled Thank you

## 2018-04-18 NOTE — Telephone Encounter (Signed)
Pt.notified

## 2018-05-13 ENCOUNTER — Telehealth: Payer: Self-pay | Admitting: Family Medicine

## 2018-05-14 NOTE — Telephone Encounter (Signed)
Lab Results  Component Value Date   CREATININE 0.73 09/11/2017   Lab Results  Component Value Date   K 4.9 09/11/2017   Lab Results  Component Value Date   HGBA1C 6.3 (H) 09/11/2017   I know the patient has a lot on her plate right now Please ask her to schedule a visit soon when she can for her diabetes and labs I'll send refill

## 2018-05-15 NOTE — Telephone Encounter (Signed)
Spoke with pt and informed her that the prescription has been sent to the pharmacy. She stated that she will call us back to schedule the appt

## 2018-05-28 ENCOUNTER — Encounter: Payer: Self-pay | Admitting: Family Medicine

## 2018-05-31 ENCOUNTER — Other Ambulatory Visit: Payer: Self-pay | Admitting: Family Medicine

## 2018-05-31 DIAGNOSIS — R809 Proteinuria, unspecified: Secondary | ICD-10-CM

## 2018-05-31 DIAGNOSIS — Z5181 Encounter for therapeutic drug level monitoring: Secondary | ICD-10-CM

## 2018-05-31 DIAGNOSIS — D721 Eosinophilia, unspecified: Secondary | ICD-10-CM

## 2018-05-31 DIAGNOSIS — E8809 Other disorders of plasma-protein metabolism, not elsewhere classified: Secondary | ICD-10-CM

## 2018-05-31 DIAGNOSIS — Z862 Personal history of diseases of the blood and blood-forming organs and certain disorders involving the immune mechanism: Secondary | ICD-10-CM

## 2018-05-31 DIAGNOSIS — E11319 Type 2 diabetes mellitus with unspecified diabetic retinopathy without macular edema: Secondary | ICD-10-CM

## 2018-05-31 DIAGNOSIS — E785 Hyperlipidemia, unspecified: Secondary | ICD-10-CM

## 2018-06-01 ENCOUNTER — Encounter: Payer: Self-pay | Admitting: Family Medicine

## 2018-06-01 ENCOUNTER — Telehealth: Payer: Self-pay

## 2018-06-01 DIAGNOSIS — D721 Eosinophilia, unspecified: Secondary | ICD-10-CM | POA: Insufficient documentation

## 2018-06-01 NOTE — Telephone Encounter (Signed)
Left detailed voicemail

## 2018-06-01 NOTE — Telephone Encounter (Signed)
Copied from [place] [id number]. Topic: General - Other >> May 31, 2018  9:53 AM Ivar Drape wrote: Reason for CRM:   Patient stated there was missing information on the [facility] paperwork that was filled out for her.  The duration of how long she will be out of work as 5 days a week.  The medical condition needs to be put on the form also.  She needs to initial and date the new information she puts in and fax it back to them. Patient would like to be notified when this is done.

## 2018-06-01 NOTE — Telephone Encounter (Signed)
Paperwork on desk not sure where these need to be documented at?

## 2018-06-01 NOTE — Assessment & Plan Note (Signed)
Check CBC with diff

## 2018-06-01 NOTE — Telephone Encounter (Signed)
Lab Results  Component Value Date   CREATININE 0.73 09/11/2017   Lab Results  Component Value Date   CHOL 249 (H) 09/11/2017   HDL 56 09/11/2017   LDLCALC 157 (H) 09/11/2017   TRIG 197 (H) 09/11/2017   CHOLHDL 4.4 09/11/2017   Please contact patient I'll refill her medicines We know things are really busy right now with what's going on with her husband I really do need labs though, even if she can't come in to be seen Appointment first available when she gets a chance  Offer to put in lab orders so she can do that at the hospital, maybe while she's waiting for him or if she just wants to get up and walk while he's recovering or after surgery, she can just go over to the hospital lab on Monday or Tuesday If she agrees, please put in the following orders for the [place] lab: A1c Lipid panel CMP or CMET Urine microalbumin:creatinine CBC with differential (dx elev eos)  I've loaded all of the pertinent diagnoses for you already

## 2018-06-01 NOTE — Telephone Encounter (Signed)
I looked at the form I think she is thinking how many flares he will have, and it's not really a flare 5 days a week I think perhaps it will be best to write her out for a continous period (think about what dates she stopped and will start back to work) and then we can also allow for her to go to doctor visits with him for the intermittent section For example, we may write out a single continuous period from March 9-20 and then allow for an average of one doctor visit per week (between me and urologist, e.g) for several weeks We'll talk Monday or Tuesday and I'll be sure to get these forms filled out No worries, we'll take care of this however we can help her

## 2018-06-06 NOTE — Telephone Encounter (Signed)
I spoke with patient; changed form to how she was instructed it  Needed to be; faxed to [employer]

## 2018-08-10 ENCOUNTER — Other Ambulatory Visit: Payer: Self-pay | Admitting: Family Medicine

## 2018-08-10 NOTE — Telephone Encounter (Signed)
Needs routine follow up around July 2020

## 2018-08-10 NOTE — Telephone Encounter (Signed)
lvm informing that prescription has been sent to pharmacy also asked pt to return call to schedule appt in july

## 2018-08-30 ENCOUNTER — Other Ambulatory Visit: Payer: Self-pay | Admitting: Family Medicine

## 2018-08-30 ENCOUNTER — Telehealth: Payer: Self-pay | Admitting: Family Medicine

## 2018-08-30 NOTE — Telephone Encounter (Signed)
Please schedule patient for follow up in the next 30 days.  

## 2018-08-31 NOTE — Telephone Encounter (Signed)
LVM for pt to call the office to schedule an appt. °

## 2018-09-25 ENCOUNTER — Other Ambulatory Visit: Payer: Self-pay | Admitting: Family Medicine

## 2018-10-09 ENCOUNTER — Other Ambulatory Visit: Payer: Self-pay | Admitting: Family Medicine

## 2018-10-09 NOTE — Telephone Encounter (Signed)
Please schedule routine appointment within the month

## 2018-11-06 NOTE — Progress Notes (Signed)
Name: Breanna Rogers   MRN: [id number]    DOB: 1965/05/23   Date:11/07/2018       Progress Note  Subjective  Chief Complaint  Chief Complaint  Patient presents with  . Follow-up    HPI  Hyperlipidemia Patient rx atorvastatin '40mg'$  night. Takes medications as prescribed with few missed doses a month.  Diet: eats vegetables daily, occasional fried foods and red meats- mainly eats chicken and fish.  Endorses chronic myalgias in neck and shoulders from fusions.  She has aortic atherosclerosis Lab Results  Component Value Date   CHOL 249 (H) 09/11/2017   HDL 56 09/11/2017   LDLCALC 157 (H) 09/11/2017   TRIG 197 (H) 09/11/2017   CHOLHDL 4.4 09/11/2017    Hypertension Patient is on lisinopril '5mg'$  daily.  Takes medications as prescribed with no missed doses a month.  She is compliant with low-salt diet.  Denies chest pain, headaches, blurry vision. BP Readings from Last 3 Encounters:  11/07/18 116/62  12/15/17 119/73  10/11/17 108/66    Diabetes Mellitus Patient is rx metformin '1000mg'$  daily. Takes medications as prescribed with rare missed doses a month.  Checks blood sugars rarely- states higher now due eating more and snacking during the pandemic. Has lost 6 pounds in the last 2 weeks but cutting down sweets and drinking more water Denies polyphagia, polydipsia, polyuria.  She is taking statin and ACEi.  She sees eye doctor annually  Lab Results  Component Value Date   HGBA1C 6.3 (H) 09/11/2017    Depression and anxiety She is taking zoloft '150mg'$  daily- has been taking 200 zoloft due to increase anxiety but has stopped taking it 4 days ago due to being out. Denies panic attack, feels she is not depressed at this time just very anxious overall. Is not in counseling.   PHQ2/9: Depression screen [facility] 2/9 11/07/2018 10/04/2017 09/11/2017 04/28/2017 10/28/2016  Decreased Interest 0 0 0 0 0  Down, Depressed, Hopeless 0 0 0 1 0  PHQ - 2 Score 0 0 0 1 0  Altered sleeping 0 0 0 - -   Tired, decreased energy 0 1 0 - -  Change in appetite 0 0 0 - -  Feeling bad or failure about yourself  0 0 0 - -  Trouble concentrating 0 2 0 - -  Moving slowly or fidgety/restless 0 0 0 - -  Suicidal thoughts 0 0 0 - -  PHQ-9 Score 0 3 0 - -  Difficult doing work/chores Not difficult at all Not difficult at all Not difficult at all - -    PHQ reviewed. Negative  Patient Active Problem List   Diagnosis Date Noted  . Eosinophilia 06/01/2018  . Polyp of sigmoid colon   . Abdominal aortic atherosclerosis ([place]) 09/19/2017  . Fusion of spine of cervical region 06/22/2017  . Breast calcification, left 05/10/2017  . Abnormal ECG 04/28/2017  . Hx of acute pancreatitis 06/06/2016  . Hyperproteinemia 04/05/2016  . Urine test positive for microalbuminuria 07/08/2015  . Mild major depression ([place]) 07/08/2015  . Dyslipidemia 06/12/2015  . Hx of iron deficiency anemia 05/07/2015  . Fecal incontinence 05/07/2015  . Diabetes mellitus type 2 with retinopathy ([place])   . 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[place])   . Anxiety 10/05/2012  . 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    Past Medical History:  Diagnosis Date  . Abdominal aortic atherosclerosis ([place]) 09/19/2017  . Diabetes mellitus type 2 with retinopathy ([place])   . Elevated  transaminase level   . Hot flashes   . Hyperlipidemia   . 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[place]) 4/15, 10/15   seen every 6 months  . Numbness of foot   . Obesity   . Pancreatitis   . Spasm of muscle   . Vitamin D deficiency     Past Surgical History:  Procedure Laterality Date  . CERVICAL FUSION  2013   C5-7 at [facility], (ACDF C5-6 with removal of hardware)  . CERVICAL FUSION  07/23/2017   C3-C7  . COLONOSCOPY WITH PROPOFOL N/A 12/15/2017   Procedure: COLONOSCOPY WITH PROPOFOL with biopsy;  Surgeon: Lucilla Lame, MD;  Location: [place];  Service: Endoscopy;  Laterality: N/A;  Diabetic - oral  meds  . POLYPECTOMY N/A 12/15/2017   Procedure: POLYPECTOMY;  Surgeon: Lucilla Lame, MD;  Location: [place];  Service: Endoscopy;  Laterality: N/A;  . TONSILLECTOMY AND ADENOIDECTOMY  1976  . TUBAL LIGATION  1990    Social History   Tobacco Use  . Smoking status: Former Smoker    Packs/day: 1.00    Years: 35.00    Pack years: 35.00    Types: Cigarettes    Quit date: 03/28/2010    Years since quitting: 8.6  . Smokeless tobacco: Never Used  Substance Use Topics  . Alcohol use: No     Current Outpatient Medications:  .  aspirin EC 81 MG tablet, Take 81 mg by mouth daily., Disp: , Rfl:  .  atorvastatin (LIPITOR) 40 MG tablet, TAKE 1 TABLET BY MOUTH AT BEDTIME, Disp: 7 tablet, Rfl: 0 .  b complex vitamins tablet, Take 1 tablet by mouth daily., Disp: , Rfl:  .  blood glucose meter kit and supplies KIT, E11.329; LON 99 months; check FSBS three times a day while on insulin, then once a day off insulin, Disp: 1 each, Rfl: 0 .  cetirizine (ZYRTEC) 10 MG tablet, Take 1 tablet (10 mg total) by mouth daily as needed., Disp: 90 tablet, Rfl: 3 .  lisinopril (ZESTRIL) 5 MG tablet, TAKE 1 TABLET(5 MG) BY MOUTH DAILY, Disp: 90 tablet, Rfl: 0 .  Magnesium 250 MG TABS, Take 250 mg by mouth daily. Take two caps daily, Disp: , Rfl:  .  metFORMIN (GLUCOPHAGE-XR) 500 MG 24 hr tablet, TAKE 2 TABLETS(1000 MG) BY MOUTH DAILY WITH BREAKFAST, Disp: 14 tablet, Rfl: 0 .  Multiple Vitamin tablet, Take 1 tablet by mouth daily., Disp: , Rfl:  .  ONE TOUCH ULTRA TEST test strip, USE TO CHECK BLOOD SUGAR THREE TIMES DAILY WHILE ON INSULIN AND EVERY DAY WHILE OFF INSULIN, Disp: 100 each, Rfl: 1 .  ONETOUCH DELICA LANCETS FINE MISC, USE TO CHECK BLOOD SUGAR THREE TIMES DAILY WHILE ON INSULIN AND EVERY DAY WHILE OFF INSULIN, Disp: 100 each, Rfl: 0 .  sertraline (ZOLOFT) 100 MG tablet, Take 1.5 tablets (150 mg total) by mouth daily. Please schedule appt, Disp: 45 tablet, Rfl: 1  Allergies  Allergen Reactions   . Trulicity [Dulaglutide] Diarrhea  . Sulfa Antibiotics Rash    ROS   No other specific complaints in a complete review of systems (except as listed in HPI above).  Objective  Vitals:   11/07/18 0917  BP: 116/62  Pulse: (!) 102  Resp: 14  Temp: (!) 96.9 F (36.1 C)  SpO2: 99%  Weight: 218 lb 14.4 oz (99.3 kg)  Height: '5\' 9"'$  (1.753 m)     Body mass index is 32.33 kg/m.  Nursing Note and Vital Signs  reviewed.  Physical Exam Constitutional:      Appearance: Normal appearance. She is well-developed.  HENT:     Head: Normocephalic and atraumatic.     Right Ear: Hearing normal.     Left Ear: Hearing normal.  Eyes:     Conjunctiva/sclera: Conjunctivae normal.  Cardiovascular:     Rate and Rhythm: Normal rate and regular rhythm.     Heart sounds: Normal heart sounds.  Pulmonary:     Effort: Pulmonary effort is normal.     Breath sounds: Normal breath sounds.  Musculoskeletal: Normal range of motion.  Neurological:     Mental Status: She is alert and oriented to person, place, and time.  Psychiatric:        Speech: Speech normal.        Behavior: Behavior normal. Behavior is cooperative.        Thought Content: Thought content normal.        Judgment: Judgment normal.       Diabetic Foot Exam - Simple   Simple Foot Form Diabetic Foot exam was performed with the following findings: Yes 11/07/2018 10:00 AM  Visual Inspection No deformities, no ulcerations, no other skin breakdown bilaterally: Yes Sensation Testing Intact to touch and monofilament testing bilaterally: Yes Pulse Check Posterior Tibialis and Dorsalis pulse intact bilaterally: Yes Comments     No results found for this or any previous visit (from the past 48 hour(s)).  Assessment & Plan  1. Abdominal aortic atherosclerosis (HCC) LDL goal under 70 - Lipid Profile  2. 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  Dispense: 90 tablet; Refill: 1  3. Dyslipidemia Will recheck today and adjust statin dose as necessary - Lipid Profile  4. MDD (major depressive disorder), recurrent, in full remission ([place]) stable  5. Mild nonproliferative diabetic retinopathy associated with type 2 diabetes mellitus, macular edema presence unspecified, unspecified laterality ([place]) Follows up with eye doctor.   6. Medication monitoring encounter - COMPLETE METABOLIC PANEL WITH GFR  7. GAD (generalized anxiety disorder) Discussed titration instructions follow up in 3 weeks.  - busPIRone (BUSPAR) 5 MG tablet; Take 1 tablet (5 mg total) by mouth 3 (three) times daily.  Dispense: 90 tablet; Refill: 0

## 2018-11-07 ENCOUNTER — Ambulatory Visit: Payer: 59 | Admitting: Nurse Practitioner

## 2018-11-07 ENCOUNTER — Encounter: Payer: Self-pay | Admitting: Nurse Practitioner

## 2018-11-07 ENCOUNTER — Other Ambulatory Visit: Payer: Self-pay

## 2018-11-07 VITALS — BP 116/62 | HR 94 | Temp 96.9°F | Resp 14 | Ht 69.0 in | Wt 218.9 lb

## 2018-11-07 DIAGNOSIS — F3342 Major depressive disorder, recurrent, in full remission: Secondary | ICD-10-CM

## 2018-11-07 DIAGNOSIS — F411 Generalized anxiety disorder: Secondary | ICD-10-CM

## 2018-11-07 DIAGNOSIS — E113299 Type 2 diabetes mellitus with mild nonproliferative diabetic retinopathy without macular edema, unspecified eye: Secondary | ICD-10-CM

## 2018-11-07 DIAGNOSIS — E11319 Type 2 diabetes mellitus with unspecified diabetic retinopathy without macular edema: Secondary | ICD-10-CM

## 2018-11-07 DIAGNOSIS — E785 Hyperlipidemia, unspecified: Secondary | ICD-10-CM

## 2018-11-07 DIAGNOSIS — I700 Atherosclerosis of aorta: Secondary | ICD-10-CM

## 2018-11-07 DIAGNOSIS — Z5181 Encounter for therapeutic drug level monitoring: Secondary | ICD-10-CM

## 2018-11-07 MED ORDER — LISINOPRIL 5 MG PO TABS: 5.0000 mg | ORAL_TABLET | Freq: Every day | ORAL | 1 refills | Status: DC

## 2018-11-07 MED ORDER — BUSPIRONE HCL 5 MG PO TABS: 5.0000 mg | ORAL_TABLET | Freq: Three times a day (TID) | ORAL | 0 refills | Status: DC

## 2018-11-07 NOTE — Patient Instructions (Addendum)
-   Start taking buspar 5mg  twice a day for 3 days, then go up to 10mg  twice a day there after. ( if you feel more anxious in the evenings we can do 10mg  in the morning and afternoon and a 5mg  dose in the evening).  If you have not heard anything from my staff in a week about any orders/referrals/studies from today, please contact us here to follow-up [phone]

## 2018-11-08 LAB — COMPLETE METABOLIC PANEL WITH GFR
AG Ratio: 1.6 (calc) (ref 1.0–2.5)
ALT: 30 U/L — ABNORMAL HIGH (ref 6–29)
AST: 24 U/L (ref 10–35)
Albumin: 4.5 g/dL (ref 3.6–5.1)
Alkaline phosphatase (APISO): 67 U/L (ref 37–153)
BUN: 8 mg/dL (ref 7–25)
CO2: 28 mmol/L (ref 20–32)
Calcium: 10.4 mg/dL (ref 8.6–10.4)
Chloride: 101 mmol/L (ref 98–110)
Creat: 0.77 mg/dL (ref 0.50–1.05)
GFR, Est African American: 103 mL/min/{1.73_m2} (ref >=60)
GFR, Est Non African American: 89 mL/min/{1.73_m2} (ref >=60)
Globulin: 2.9 g/dL (calc) (ref 1.9–3.7)
Glucose, Bld: 286 mg/dL — ABNORMAL HIGH (ref 65–99)
Potassium: 5.6 mmol/L — ABNORMAL HIGH (ref 3.5–5.3)
Sodium: 140 mmol/L (ref 135–146)
Total Bilirubin: 0.7 mg/dL (ref 0.2–1.2)
Total Protein: 7.4 g/dL (ref 6.1–8.1)

## 2018-11-08 LAB — LIPID PANEL
Cholesterol: 197 mg/dL (ref <200)
HDL: 45 mg/dL — ABNORMAL LOW (ref >=50)
LDL Cholesterol (Calc): 116 mg/dL (calc) — ABNORMAL HIGH
Non-HDL Cholesterol (Calc): 152 mg/dL (calc) — ABNORMAL HIGH (ref <130)
Total CHOL/HDL Ratio: 4.4 (calc) (ref <5.0)
Triglycerides: 238 mg/dL — ABNORMAL HIGH (ref <150)

## 2018-11-08 LAB — HEMOGLOBIN A1C
Hgb A1c MFr Bld: 11.1 % of total Hgb — ABNORMAL HIGH (ref <5.7)
Mean Plasma Glucose: 272 (calc)
eAG (mmol/L): 15.1 (calc)

## 2018-11-09 ENCOUNTER — Other Ambulatory Visit: Payer: Self-pay | Admitting: Nurse Practitioner

## 2018-11-09 DIAGNOSIS — E1165 Type 2 diabetes mellitus with hyperglycemia: Secondary | ICD-10-CM

## 2018-11-09 MED ORDER — ATORVASTATIN CALCIUM 80 MG PO TABS: 80.0000 mg | ORAL_TABLET | Freq: Every day | ORAL | 0 refills | Status: DC

## 2018-11-09 MED ORDER — TRESIBA FLEXTOUCH 100 UNIT/ML ~~LOC~~ SOPN: 10.0000 [IU] | PEN_INJECTOR | Freq: Every day | SUBCUTANEOUS | 0 refills | Status: DC

## 2018-11-09 MED ORDER — METFORMIN HCL 1000 MG PO TABS: 1000.0000 mg | ORAL_TABLET | Freq: Two times a day (BID) | ORAL | 0 refills | Status: DC

## 2018-11-11 ENCOUNTER — Encounter: Payer: Self-pay | Admitting: Family Medicine

## 2018-11-12 ENCOUNTER — Other Ambulatory Visit: Payer: Self-pay | Admitting: Nurse Practitioner

## 2018-11-12 ENCOUNTER — Telehealth: Payer: Self-pay

## 2018-11-12 DIAGNOSIS — IMO0001 Reserved for inherently not codable concepts without codable children: Secondary | ICD-10-CM

## 2018-11-12 MED ORDER — BLOOD GLUCOSE MONITOR KIT: PACK | 0 refills | Status: DC

## 2018-11-12 MED ORDER — INSULIN PEN NEEDLE 32G X 4 MM MISC: 3 refills | Status: DC

## 2018-11-12 NOTE — Telephone Encounter (Unsigned)
Copied from [place] [id number]. Topic: General - Other >> Nov 12, 2018 12:46 PM Leward Quan A wrote: Reason for CRM: Patient called to say that she have picked up her insulin pens at the Pharmacy but did not get any pen needles. Patient would like an Rx sent to the pharmacy today please so that she can start using her insulin. Also ask for a call back when this Rx is sent please call  Ph#  [phone]

## 2018-11-12 NOTE — Telephone Encounter (Unsigned)
Copied from [place] [id number]. Topic: General - Other >> Nov 12, 2018 12:46 PM Leward Quan A wrote: Reason for CRM: Patient called to say that she have picked up her insulin pens at the Pharmacy but did not get any pen needles. Patient would like an Rx sent to the pharmacy today please so that she can start using her insulin. Also ask for a call back when this Rx is sent please call  Ph#  [phone]

## 2018-11-12 NOTE — Telephone Encounter (Signed)
Copied from [place] [id number]. Topic: General - Other >> Nov 12, 2018 12:46 PM Leward Quan A wrote: Reason for CRM: Patient called to say that she have picked up her insulin pens at the Pharmacy but did not get any pen needles. Patient would like an Rx sent to the pharmacy today please so that she can start using her insulin. Also ask for a call back when this Rx is sent please call  Ph#  [phone]

## 2018-11-13 ENCOUNTER — Other Ambulatory Visit: Payer: Self-pay | Admitting: Nurse Practitioner

## 2018-11-13 NOTE — Telephone Encounter (Signed)
Can you please call in a new glucometer per insurance coverage with strips and lancets for Breanna Rogers.  Thank you.  For insulin dependent DM

## 2018-11-14 ENCOUNTER — Other Ambulatory Visit: Payer: Self-pay

## 2018-11-14 MED ORDER — ACCU-CHEK FASTCLIX LANCETS MISC: 1 refills | Status: DC

## 2018-11-14 MED ORDER — BLOOD GLUCOSE MONITOR KIT: PACK | 0 refills | Status: DC

## 2018-11-18 ENCOUNTER — Other Ambulatory Visit: Payer: Self-pay | Admitting: Family Medicine

## 2018-11-21 ENCOUNTER — Encounter: Payer: Self-pay | Admitting: Family Medicine

## 2018-11-21 ENCOUNTER — Other Ambulatory Visit: Payer: Self-pay | Admitting: Nurse Practitioner

## 2018-11-21 DIAGNOSIS — F3342 Major depressive disorder, recurrent, in full remission: Secondary | ICD-10-CM

## 2018-11-21 DIAGNOSIS — F411 Generalized anxiety disorder: Secondary | ICD-10-CM

## 2018-11-21 NOTE — Telephone Encounter (Signed)
You saw this patient and managed the buspirone and metformin - so thought it would be better for you to get this!

## 2018-11-30 ENCOUNTER — Ambulatory Visit: Payer: 59 | Admitting: Family Medicine

## 2019-02-06 ENCOUNTER — Other Ambulatory Visit: Payer: Self-pay

## 2019-02-06 DIAGNOSIS — E1165 Type 2 diabetes mellitus with hyperglycemia: Secondary | ICD-10-CM

## 2019-02-07 NOTE — Telephone Encounter (Signed)
"  pt states since Dr. Sanda Klein left this place has went to shits"

## 2019-02-07 NOTE — Telephone Encounter (Signed)
Called patient she states will find another doctor

## 2019-02-11 ENCOUNTER — Encounter: Payer: 59 | Admitting: Family Medicine

## 2021-01-21 LAB — HM HEPATITIS C SCREENING LAB: HM Hepatitis Screen: NEGATIVE

## 2022-03-29 LAB — PROTEIN / CREATININE RATIO, URINE
Albumin, U: 0.3
Creatinine, Urine: 5

## 2022-03-29 LAB — MICROALBUMIN / CREATININE URINE RATIO: Microalb Creat Ratio: 5.9

## 2022-03-29 LAB — MICROALBUMIN, URINE: Microalb, Ur: NORMAL

## 2022-09-13 ENCOUNTER — Ambulatory Visit: Payer: 59 | Admitting: Internal Medicine

## 2022-09-13 ENCOUNTER — Encounter: Payer: Self-pay | Admitting: Internal Medicine

## 2022-09-13 VITALS — BP 122/62 | HR 109 | Temp 98.0°F | Resp 18 | Ht 69.0 in | Wt 208.6 lb

## 2022-09-13 DIAGNOSIS — Z122 Encounter for screening for malignant neoplasm of respiratory organs: Secondary | ICD-10-CM

## 2022-09-13 DIAGNOSIS — E11319 Type 2 diabetes mellitus with unspecified diabetic retinopathy without macular edema: Secondary | ICD-10-CM | POA: Diagnosis not present

## 2022-09-13 DIAGNOSIS — F419 Anxiety disorder, unspecified: Secondary | ICD-10-CM

## 2022-09-13 DIAGNOSIS — Z1231 Encounter for screening mammogram for malignant neoplasm of breast: Secondary | ICD-10-CM

## 2022-09-13 DIAGNOSIS — E785 Hyperlipidemia, unspecified: Secondary | ICD-10-CM | POA: Diagnosis not present

## 2022-09-13 DIAGNOSIS — Z7984 Long term (current) use of oral hypoglycemic drugs: Secondary | ICD-10-CM

## 2022-09-13 LAB — CBC WITH DIFFERENTIAL/PLATELET
Basophils Absolute: 90 cells/uL (ref 0–200)
Basophils Relative: 1.1 %
Eosinophils Absolute: 902 cells/uL — ABNORMAL HIGH (ref 15–500)
HCT: 38.2 % (ref 35.0–45.0)
MCH: 30.3 pg (ref 27.0–33.0)
MPV: 10.2 fL (ref 7.5–12.5)
RDW: 12.7 % (ref 11.0–15.0)
Total Lymphocyte: 27.4 %

## 2022-09-13 MED ORDER — SERTRALINE HCL 50 MG PO TABS: 50.0000 mg | ORAL_TABLET | Freq: Every day | ORAL | 1 refills | Status: DC

## 2022-09-13 MED ORDER — ATORVASTATIN CALCIUM 80 MG PO TABS: 80.0000 mg | ORAL_TABLET | Freq: Every day | ORAL | 1 refills | Status: DC

## 2022-09-13 MED ORDER — METFORMIN HCL ER 500 MG PO TB24
1000.0000 mg | ORAL_TABLET | Freq: Two times a day (BID) | ORAL | 2 refills | Status: DC
Start: 2022-09-13 — End: 2022-10-31

## 2022-09-13 MED ORDER — LISINOPRIL 5 MG PO TABS: 5.0000 mg | ORAL_TABLET | Freq: Every day | ORAL | 1 refills | Status: DC

## 2022-09-13 NOTE — Patient Instructions (Addendum)
It was great seeing you today!  Plan discussed at today's visit: -Blood work ordered today, results will be uploaded to MyChart.  -Medications sent to pharmacy  -Mammogram ordered, please call number on card to schedule -Lung cancer screening ordered  Follow up in: CPE with Pap in 3 months   Take care and let us know if you have any questions or concerns prior to your next visit.  Dr. Caralee Ates

## 2022-09-13 NOTE — Progress Notes (Signed)
New Patient Office Visit  Subjective    Patient ID: Breanna Rogers, female    DOB: 1965/10/19  Age: 57 y.o. MRN: [id number]  CC:  Chief Complaint  Patient presents with   Establish Care    HPI Breanna Rogers presents to establish care.  HLD: -Medications: Lipitor 80 mg -Patient is compliant with above medications and reports no side effects.  -Last lipid panel: Lipid Panel - last results 10/2021 - TC 117, triglycerides 138, HDL 50, LDL 39    Component Value Date/Time   CHOL 197 11/07/2018 1009   CHOL 239 (H) 05/07/2015 0844   TRIG 238 (H) 11/07/2018 1009   HDL 45 (L) 11/07/2018 1009   HDL 42 05/07/2015 0844   CHOLHDL 4.4 11/07/2018 1009   VLDL 37 06/06/2016 1243   LDLCALC 116 (H) 11/07/2018 1009   LABVLDL 53 (H) 05/07/2015 0844   Diabetes, Type 2: -Last A1c 1/24 8.4% -Medications: Metformin 2000 mg XR, Lisinopril 5 mg -Patient is compliant with the above medications and reports no side effects.  -Failed medications: Ozempic caused pancreatitis, Trulicity causes nausea -Checking BG at home: not currently  -Diet: starting to eat more healthy after pancreatitis  -Exercise: walks every day -Eye exam: Last in June 2023, will call to schedule - apparently has a history of retinopathy  -Foot exam: Due -Microalbumin: UTD 1/24 -Statin: yes -PNA vaccine: UTD -Denies symptoms of hypoglycemia, polyuria, polydipsia, numbness extremities, foot ulcers/trauma.   MDD: -Mood status: controlled -Current treatment: Zoloft 50 mg -Satisfied with current treatment?: yes  -Duration of current treatment : chronic -Side effects: no Medication compliance: excellent compliance     09/13/2022    2:00 PM 11/07/2018    9:20 AM 10/04/2017    9:06 AM 09/11/2017    8:58 AM 04/28/2017   11:13 AM  Depression screen PHQ 2/9  Decreased Interest 0 0 0 0 0  Down, Depressed, Hopeless 0 0 0 0 1  PHQ - 2 Score 0 0 0 0 1  Altered sleeping 0 0 0 0   Tired, decreased energy 0 0 1 0   Change in appetite  0 0 0 0   Feeling bad or failure about yourself  0 0 0 0   Trouble concentrating 0 0 2 0   Moving slowly or fidgety/restless 0 0 0 0   Suicidal thoughts 0 0 0 0   PHQ-9 Score 0 0 3 0   Difficult doing work/chores Not difficult at all Not difficult at all Not difficult at all Not difficult at all    Health Maintenance: -Blood work due in August -Mammogram due -Colon cancer screening: colonoscopy 11/2017, repeat in 10 years  -Pap due   Outpatient Encounter Medications as of 09/13/2022  Medication Sig   aspirin EC 81 MG tablet Take 81 mg by mouth daily.   atorvastatin (LIPITOR) 80 MG tablet Take 1 tablet (80 mg total) by mouth daily.   lisinopril (ZESTRIL) 5 MG tablet Take 1 tablet (5 mg total) by mouth daily.   metFORMIN (GLUCOPHAGE-XR) 500 MG 24 hr tablet Take 500 mg by mouth daily with breakfast. 4 tablets at breakfast   Multiple Vitamin tablet Take 1 tablet by mouth daily.   sertraline (ZOLOFT) 50 MG tablet Take 50 mg by mouth daily. 4 pills at night   Accu-Chek FastClix Lancets MISC Check blood sugar 1-3 times daily.   blood glucose meter kit and supplies KIT E11.329; LON 99 months; check FSBS three times a day while on  insulin, then once a day off insulin   ONE TOUCH ULTRA TEST test strip USE TO CHECK BLOOD SUGAR THREE TIMES DAILY WHILE ON INSULIN AND EVERY DAY WHILE OFF INSULIN   [DISCONTINUED] b complex vitamins tablet Take 1 tablet by mouth daily.   [DISCONTINUED] cetirizine (ZYRTEC) 10 MG tablet TAKE 1 TABLET(10 MG) BY MOUTH DAILY AS NEEDED   [DISCONTINUED] insulin degludec (TRESIBA FLEXTOUCH) 100 UNIT/ML SOPN FlexTouch Pen Inject 0.1 mLs (10 Units total) into the skin daily.   [DISCONTINUED] Insulin Pen Needle 32G X 4 MM MISC Use as directed with insulin daily   [DISCONTINUED] Magnesium 250 MG TABS Take 250 mg by mouth daily. Take two caps daily   [DISCONTINUED] metFORMIN (GLUCOPHAGE) 1000 MG tablet Take 1 tablet (1,000 mg total) by mouth 2 (two) times daily with a meal. Please  follow mychart increasing instructions   No facility-administered encounter medications on file as of 09/13/2022.    Past Medical History:  Diagnosis Date   Abdominal aortic atherosclerosis (HCC) 09/19/2017   Diabetes mellitus type 2 with retinopathy (HCC)    Elevated transaminase level    Hot flashes    Hyperlipidemia    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HCC) 4/15, 10/15   seen every 6 months   Numbness of foot    Obesity    Pancreatitis    Spasm of muscle    Vitamin D deficiency     Past Surgical History:  Procedure Laterality Date   CERVICAL FUSION  2013   C5-7 at [facility], (ACDF C5-6 with removal of hardware)   CERVICAL FUSION  07/23/2017   C3-C7   COLONOSCOPY WITH PROPOFOL N/A 12/15/2017   Procedure: COLONOSCOPY WITH PROPOFOL with biopsy;  Surgeon: Midge Minium, MD;  [facility];  Service: Endoscopy;  Laterality: N/A;  Diabetic - oral meds   POLYPECTOMY N/A 12/15/2017   Procedure: POLYPECTOMY;  Surgeon: Midge Minium, MD;  Location: [facility];  Service: Endoscopy;  Laterality: N/A;   TONSILLECTOMY AND ADENOIDECTOMY  1976   TUBAL LIGATION  1990    Family History  Problem Relation Age of Onset   Cancer Mother        cervical   Asthma Mother    Diabetes Mother    Heart disease Mother    Hyperlipidemia Mother    Heart disease Father    Hyperlipidemia Father    Hypertension Father    Heart attack Father    Thyroid disease Sister    Cancer Sister    Diabetes Brother    Hypertension Brother    Diabetes Sister    Heart attack Paternal Grandfather    Diabetes Sister    Heart disease Sister    Heart attack Sister    COPD Sister    Stroke Neg Hx    Breast cancer Neg Hx     Social History   Socioeconomic History   Marital status: Married    Spouse name: Not on file   Number of children: Not on file   Years of education: Not on file   Highest education level: Not on file  Occupational History   Not on file  Tobacco Use    Smoking status: Former    Packs/day: 1.00    Years: 35.00    Additional pack years: 0.00    Total pack years: 35.00    Types: Cigarettes    Quit date: 03/28/2010    Years since quitting: 12.4   Smokeless tobacco: Never  Vaping Use   Vaping  Use: Never used  Substance and Sexual Activity   Alcohol use: No   Drug use: No   Sexual activity: Not Currently  Other Topics Concern   Not on file  Social History Narrative   Not on file   Social Determinants of Health   Financial Resource Strain: Not on file  Food Insecurity: Not on file  Transportation Needs: Not on file  Physical Activity: Not on file  Stress: Not on file  Social Connections: Not on file  Intimate Partner Violence: Not on file    Review of Systems  All other systems reviewed and are negative.       Objective    BP 122/62   Pulse (!) 109   Temp 98 F (36.7 C)   Resp 18   Ht 5\' 9"  (1.753 m)   Wt 208 lb 9.6 oz (94.6 kg)   SpO2 98%   BMI 30.80 kg/m   Physical Exam Constitutional:      Appearance: Normal appearance.  HENT:     Head: Normocephalic and atraumatic.     Mouth/Throat:     Mouth: Mucous membranes are moist.     Pharynx: Oropharynx is clear.  Eyes:     Extraocular Movements: Extraocular movements intact.     Conjunctiva/sclera: Conjunctivae normal.     Pupils: Pupils are equal, round, and reactive to light.  Cardiovascular:     Rate and Rhythm: Normal rate and regular rhythm.     Pulses:          Dorsalis pedis pulses are 2+ on the right side and 2+ on the left side.  Pulmonary:     Effort: Pulmonary effort is normal.     Breath sounds: Normal breath sounds.  Musculoskeletal:     Right lower leg: No edema.     Left lower leg: No edema.     Right foot: Normal range of motion. No deformity, bunion, Charcot foot, foot drop or prominent metatarsal heads.     Left foot: Normal range of motion. No deformity, bunion, Charcot foot, foot drop or prominent metatarsal heads.  Feet:     Right  foot:     Protective Sensation: 6 sites tested.  6 sites sensed.     Skin integrity: Skin integrity normal.     Toenail Condition: Right toenails are normal.     Left foot:     Protective Sensation: 6 sites tested.  6 sites sensed.     Skin integrity: Skin integrity normal.     Toenail Condition: Left toenails are normal.  Skin:    General: Skin is warm and dry.  Neurological:     General: No focal deficit present.     Mental Status: She is alert. Mental status is at baseline.  Psychiatric:        Mood and Affect: Mood normal.        Behavior: Behavior normal.         Assessment & Plan:   1. 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 Cannot tolerate GLP-1's due to side effects, hospitalized for pancreatitis. Will recheck labs, foot exam today. Refill Metformin. Does have retinopathy per patient.   - COMPLETE METABOLIC PANEL WITH GFR - CBC w/Diff/Platelet - HgB A1c - lisinopril (ZESTRIL) 5 MG tablet; Take 1 tablet (5 mg total) by mouth daily.  Dispense: 90 tablet; Refill: 1 - metFORMIN (GLUCOPHAGE-XR) 500 MG 24 hr tablet; Take 2 tablets (1,000 mg total) by mouth 2 (two) times daily with a meal.  Dispense: 120 tablet; Refill: 2 - HM Diabetes Foot Exam  2. Dyslipidemia: Recheck lipid panel today. Cholesterol normal in August of 2023, will continue Lipitor 80 mg.   - Lipid Profile - atorvastatin (LIPITOR) 80 MG tablet; Take 1 tablet (80 mg total) by mouth daily.  Dispense: 90 tablet; Refill: 1  3. Anxiety: Stable, continue Zoloft 50 mg daily, refilled.   - sertraline (ZOLOFT) 50 MG tablet; Take 1 tablet (50 mg total) by mouth daily. 4 pills at night  Dispense: 90 tablet; Refill: 1  4. Encounter for screening mammogram for malignant neoplasm of breast: Mammogram ordered.   - MM 3D  SCREENING MAMMOGRAM BILATERAL BREAST; Future  5. Screening for lung cancer: Screening due ordered.   - Ambulatory Referral Lung Cancer Screening [facility]   Return in 3 months (on 12/14/2022) for CPE w/ pap.   Margarita Mail, DO

## 2022-09-14 ENCOUNTER — Other Ambulatory Visit: Payer: Self-pay | Admitting: Internal Medicine

## 2022-09-14 DIAGNOSIS — F419 Anxiety disorder, unspecified: Secondary | ICD-10-CM

## 2022-09-14 LAB — LIPID PANEL
Cholesterol: 120 mg/dL (ref <200)
HDL: 38 mg/dL — ABNORMAL LOW (ref >=50)
LDL Cholesterol (Calc): 53 mg/dL (calc)
Non-HDL Cholesterol (Calc): 82 mg/dL (calc) (ref <130)
Total CHOL/HDL Ratio: 3.2 (calc) (ref <5.0)
Triglycerides: 248 mg/dL — ABNORMAL HIGH (ref <150)

## 2022-09-14 LAB — COMPLETE METABOLIC PANEL WITH GFR
AG Ratio: 1.7 (calc) (ref 1.0–2.5)
ALT: 21 U/L (ref 6–29)
AST: 21 U/L (ref 10–35)
Albumin: 4.7 g/dL (ref 3.6–5.1)
Alkaline phosphatase (APISO): 75 U/L (ref 37–153)
BUN: 20 mg/dL (ref 7–25)
CO2: 22 mmol/L (ref 20–32)
Calcium: 9.8 mg/dL (ref 8.6–10.4)
Chloride: 101 mmol/L (ref 98–110)
Creat: 0.98 mg/dL (ref 0.50–1.03)
Globulin: 2.8 g/dL (calc) (ref 1.9–3.7)
Glucose, Bld: 150 mg/dL — ABNORMAL HIGH (ref 65–99)
Potassium: 4.7 mmol/L (ref 3.5–5.3)
Sodium: 136 mmol/L (ref 135–146)
Total Bilirubin: 0.7 mg/dL (ref 0.2–1.2)
Total Protein: 7.5 g/dL (ref 6.1–8.1)
eGFR: 68 mL/min/{1.73_m2} (ref >=60)

## 2022-09-14 LAB — CBC WITH DIFFERENTIAL/PLATELET
Absolute Monocytes: 525 cells/uL (ref 200–950)
Eosinophils Relative: 11 %
Hemoglobin: 12.9 g/dL (ref 11.7–15.5)
Lymphs Abs: 2247 cells/uL (ref 850–3900)
MCHC: 33.8 g/dL (ref 32.0–36.0)
MCV: 89.7 fL (ref 80.0–100.0)
Monocytes Relative: 6.4 %
Neutro Abs: 4436 cells/uL (ref 1500–7800)
Neutrophils Relative %: 54.1 %
Platelets: 247 10*3/uL (ref 140–400)
RBC: 4.26 10*6/uL (ref 3.80–5.10)
WBC: 8.2 10*3/uL (ref 3.8–10.8)

## 2022-09-14 LAB — HEMOGLOBIN A1C
Hgb A1c MFr Bld: 8.6 % of total Hgb — ABNORMAL HIGH (ref <5.7)
Mean Plasma Glucose: 200 mg/dL
eAG (mmol/L): 11.1 mmol/L

## 2022-09-14 NOTE — Telephone Encounter (Signed)
Requested medication (s) are due for refill today - no  Requested medication (s) are on the active medication list -yes  Future visit scheduled -yes  Last refill: 09/13/22  Notes to clinic: Pharmacy request: Please verify Rx directions and quanity  Requested Prescriptions  Pending Prescriptions Disp Refills   sertraline (ZOLOFT) 50 MG tablet [Pharmacy Med Name: SERTRALINE (ZOLOFT) 50 MG TABLET] 90 tablet 1    Sig: Take 1 tablet (50 mg total) by mouth daily. 4 pills at night     Psychiatry:  Antidepressants - SSRI - sertraline Passed - 09/14/2022 10:41 AM      Passed - AST in normal range and within 360 days    AST  Date Value Ref Range Status  09/13/2022 21 10 - 35 U/L Final   SGOT(AST)  Date Value Ref Range Status  03/26/2013 29 15 - 37 Unit/L Final         Passed - ALT in normal range and within 360 days    ALT  Date Value Ref Range Status  09/13/2022 21 6 - 29 U/L Final   SGPT (ALT)  Date Value Ref Range Status  03/26/2013 44 12 - 78 U/L Final         Passed - Completed PHQ-2 or PHQ-9 in the last 360 days      Passed - Valid encounter within last 6 months    Recent Outpatient Visits           Yesterday 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, without long-term current use of insulin, unspecified retinopathy severity [facility])   [place] [place], Janit Bern, MD   5 years ago Shortness of breath   [facility] [place],  Janit Bern, MD       Future Appointments             In 3 months Margarita Mail, DO [facility]               Requested Prescriptions  Pending Prescriptions Disp Refills   sertraline (ZOLOFT) 50 MG tablet [Pharmacy Med Name: SERTRALINE (ZOLOFT) 50 MG TABLET] 90 tablet 1    Sig: Take 1 tablet (50 mg total) by mouth daily. 4 pills at night     Psychiatry:  Antidepressants - SSRI - sertraline Passed - 09/14/2022 10:41 AM      Passed - AST in normal range and within 360 days    AST  Date Value Ref Range Status  09/13/2022 21 10 - 35 U/L Final   SGOT(AST)  Date Value Ref Range Status  03/26/2013 29 15 - 37 Unit/L Final         Passed - ALT in normal range and within 360 days    ALT  Date Value Ref Range Status  09/13/2022 21 6 - 29 U/L Final   SGPT (ALT)  Date Value Ref Range Status  03/26/2013 44 12 - 78 U/L Final  Passed - Completed PHQ-2 or PHQ-9 in the last 360 days      Passed - Valid encounter within last 6 months    Recent Outpatient Visits           Yesterday 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, without long-term current use of insulin, unspecified retinopathy severity [facility])   [place] [facility] Margarita Mail, DO   3 years ago Abdominal aortic atherosclerosis [facility])   [place] [facility] Cheryle Horsfall, NP   4 years ago Preventative health care   [facility] [place], Janit Bern, MD   5 years ago 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

## 2022-10-07 ENCOUNTER — Ambulatory Visit: Payer: 59 | Admitting: Internal Medicine

## 2022-10-11 NOTE — Progress Notes (Signed)
Name: Breanna Rogers   MRN: [id number]    DOB: 04-18-1965   Date:10/14/2022       Progress Note  Subjective  Chief Complaint  Chief Complaint  Patient presents with   Annual Exam    W/pap    HPI  Patient presents for annual CPE and follow up on recent labs.   HLD: -Medications: Lipitor 80 mg -Patient is compliant with above medications and reports no side effects.  -Last lipid panel: Lipid Panel     Component Value Date/Time   CHOL 120 09/13/2022 1458   CHOL 239 (H) 05/07/2015 0844   TRIG 248 (H) 09/13/2022 1458   HDL 38 (L) 09/13/2022 1458   HDL 42 05/07/2015 0844   CHOLHDL 3.2 09/13/2022 1458   VLDL 37 06/06/2016 1243   LDLCALC 53 09/13/2022 1458   LABVLDL 53 (H) 05/07/2015 0844    Diabetes, Type 2: -Last A1c 6/24 8.6% -Medications: Metformin 2000 mg XR, Lisinopril 5 mg -Patient is compliant with the above medications and reports no side effects.  -Failed medications: Ozempic caused pancreatitis, Trulicity causes nausea -Checking BG at home: not currently  -Diet: starting to eat more healthy after pancreatitis  -Exercise: walks every day -Eye exam: Last in June 2023, will call to schedule - apparently has a history of retinopathy  -Foot exam: UTD 6/24 -Microalbumin: UTD 1/24 -Statin: yes -PNA vaccine: UTD -Denies symptoms of hypoglycemia, polyuria, polydipsia, numbness extremities, foot ulcers/trauma.    Diet: not well rounded, not restrictive Exercise: walk at night occasionally but not regularly   Last Eye Exam: needs to schedule Last Dental Exam: UTD  Flowsheet Row Office Visit from 11/07/2018 in [facility]  AUDIT-C Score 0      Depression: Phq 9 is  negative    10/14/2022    2:49 PM 09/13/2022    2:00 PM 11/07/2018    9:20 AM 10/04/2017    9:06 AM 09/11/2017    8:58 AM  Depression screen PHQ 2/9  Decreased Interest 0 0 0 0 0  Down, Depressed, Hopeless 0 0 0 0 0  PHQ - 2 Score 0 0 0 0 0  Altered sleeping 0 0 0 0 0   Tired, decreased energy 0 0 0 1 0  Change in appetite 0 0 0 0 0  Feeling bad or failure about yourself  0 0 0 0 0  Trouble concentrating 0 0 0 2 0  Moving slowly or fidgety/restless 0 0 0 0 0  Suicidal thoughts 0 0 0 0 0  PHQ-9 Score 0 0 0 3 0  Difficult doing work/chores Not difficult at all Not difficult at all Not difficult at all Not difficult at all Not difficult at all   Hypertension: BP Readings from Last 3 Encounters:  10/14/22 120/62  09/13/22 122/62  11/07/18 116/62   Obesity: Wt Readings from Last 3 Encounters:  10/14/22 210 lb 4.8 oz (95.4 kg)  09/13/22 208 lb 9.6 oz (94.6 kg)  11/07/18 218 lb 14.4 oz (99.3 kg)   BMI Readings from Last 3 Encounters:  10/14/22 31.06 kg/m  09/13/22 30.80 kg/m  11/07/18 32.33 kg/m     Vaccines:  Tdap: complete 07/11/13 Shingrix: Info given  Pneumonia: Complete 20-4/27/23 Flu: not due yet COVID-19: no COVID vaccines    Hep C Screening: Complete STD testing and prevention (HIV/chl/gon/syphilis): HIV screen: Complete Intimate partner violence: negative screen  LMP: 2003   Discussed importance of follow up if any post-menopausal bleeding: yes  Incontinence  Symptoms: negative for symptoms   Breast cancer:  - Last Mammogram: Scheduled for 12/21/22  Osteoporosis Prevention : Discussed high calcium and vitamin D supplementation, weight bearing exercises Bone density :not applicable   Cervical cancer screening: Due today  Skin cancer: Discussed monitoring for atypical lesions  Colorectal cancer: Complete repeat 10 years 12/15/17   Lung cancer:  Low Dose CT Chest recommended if Age 14-80 years, 20 pack-year currently smoking OR have quit w/in 15years. Patient does qualify for screen  Referral placed 09/13/22 ECG: last one:04/28/17  Advanced Care Planning: A voluntary discussion about advance care planning including the explanation and discussion of advance directives.  Discussed health care proxy and Living will, and the patient  was able to identify a health care proxy as husband Dalena Graci and daughter Ariyana Schaffer.  Patient does not have a living will and power of attorney of health care   Lipids: Lab Results  Component Value Date   CHOL 120 09/13/2022   CHOL 197 11/07/2018   CHOL 249 (H) 09/11/2017   Lab Results  Component Value Date   HDL 38 (L) 09/13/2022   HDL 45 (L) 11/07/2018   HDL 56 09/11/2017   Lab Results  Component Value Date   LDLCALC 53 09/13/2022   LDLCALC 116 (H) 11/07/2018   LDLCALC 157 (H) 09/11/2017   Lab Results  Component Value Date   TRIG 248 (H) 09/13/2022   TRIG 238 (H) 11/07/2018   TRIG 197 (H) 09/11/2017   Lab Results  Component Value Date   CHOLHDL 3.2 09/13/2022   CHOLHDL 4.4 11/07/2018   CHOLHDL 4.4 09/11/2017   No results found for: "LDLDIRECT"  Glucose: Glucose  Date Value Ref Range Status  03/26/2013 135 (H) 65 - 99 mg/dL Final   Glucose, Bld  Date Value Ref Range Status  09/13/2022 150 (H) 65 - 99 mg/dL Final    Comment:    .            Fasting reference interval . For someone without known diabetes, a glucose value >125 mg/dL indicates that they may have diabetes and this should be confirmed with a follow-up test. .   11/07/2018 286 (H) 65 - 99 mg/dL Final    Comment:    .            Fasting reference interval . For someone without known diabetes, a glucose value >125 mg/dL indicates that they may have diabetes and this should be confirmed with a follow-up test. .   09/11/2017 131 (H) 65 - 99 mg/dL Final    Comment:    .            Fasting reference interval . For someone without known diabetes, a glucose value >125 mg/dL indicates that they may have diabetes and this should be confirmed with a follow-up test. .    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   Dyslipidemia 06/12/2015   Hx of iron deficiency anemia 05/07/2015   Fecal incontinence 05/07/2015   Diabetes mellitus type 2 with retinopathy (HCC)    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HCC)    Anxiety 10/05/2012   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    Past Surgical History:  Procedure Laterality Date   CERVICAL FUSION  2013   C5-7 at [facility], (ACDF C5-6 with removal of hardware)   CERVICAL FUSION  07/23/2017   C3-C7   COLONOSCOPY WITH PROPOFOL N/A 12/15/2017   Procedure: COLONOSCOPY WITH PROPOFOL with biopsy;  Surgeon: Midge Minium, MD;  Location: [facility];  Service: Endoscopy;  Laterality: N/A;  Diabetic - oral meds   POLYPECTOMY N/A 12/15/2017   Procedure: POLYPECTOMY;  Surgeon: Midge Minium, MD;  Location: [facility];  Service: Endoscopy;  Laterality: N/A;   TONSILLECTOMY AND ADENOIDECTOMY  1976   TUBAL LIGATION  1990    Family History  Problem Relation Age of Onset   Cancer Mother        cervical   Asthma Mother    Diabetes Mother    Heart disease Mother    Hyperlipidemia Mother    Heart disease Father    Hyperlipidemia Father    Hypertension Father    Heart attack Father    Thyroid disease Sister    Cancer Sister    Diabetes Brother    Hypertension Brother    Diabetes Sister    Heart attack Paternal Grandfather    Diabetes Sister    Heart disease Sister    Heart attack Sister    COPD Sister    Stroke Neg Hx    Breast cancer Neg Hx     Social History   Socioeconomic History   Marital status: Married     Spouse name: Not on file   Number of children: Not on file   Years of education: Not on file   Highest education level: Some college, no degree  Occupational History   Not on file  Tobacco Use   Smoking status: Former    Current packs/day: 0.00    Average packs/day: 1 pack/day for 35.0 years (35.0 ttl pk-yrs)    Types: Cigarettes    Start date: 03/29/1975    Quit date: 03/28/2010    Years since quitting: 12.5   Smokeless tobacco: Never  Vaping Use   Vaping status: Never Used  Substance and Sexual Activity   Alcohol use: No   Drug use: No   Sexual activity: Not Currently  Other Topics Concern   Not on file  Social History Narrative   Not on file   Social Determinants of Health   Financial Resource Strain: Low Risk  (10/14/2022)   Overall Financial Resource Strain (CARDIA)    Difficulty of Paying Living Expenses: Not hard at all  Food Insecurity: No Food Insecurity (10/14/2022)   Hunger Vital Sign    Worried About Running Out of Food in the Last Year: Never true    Ran Out of Food in the Last Year: Never true  Transportation Needs: No Transportation Needs (10/14/2022)   PRAPARE - Administrator, Civil Service (Medical): No    Lack of Transportation (Non-Medical): No  Physical Activity: Inactive (10/14/2022)   Exercise Vital Sign    Days of Exercise per Week: 0 days    Minutes of Exercise per Session: 0 min  Stress: No Stress Concern Present (10/14/2022)   [employer] of Occupational  Health - Occupational Stress Questionnaire    Feeling of Stress : Not at all  Social Connections: Moderately Isolated (10/14/2022)   Social Connection and Isolation Panel [NHANES]    Frequency of Communication with Friends and Family: Three times a week    Frequency of Social Gatherings with Friends and Family: More than three times a week    Attends Religious Services: Never    Database administrator or Organizations: No    Attends Banker Meetings: Never    Marital  Status: Married  Catering manager Violence: Not At Risk (10/14/2022)   Humiliation, Afraid, Rape, and Kick questionnaire    Fear of Current or Ex-Partner: No    Emotionally Abused: No    Physically Abused: No    Sexually Abused: No     Current Outpatient Medications:    Accu-Chek FastClix Lancets MISC, Check blood sugar 1-3 times daily., Disp: 306 each, Rfl: 1   aspirin EC 81 MG tablet, Take 81 mg by mouth daily., Disp: , Rfl:    atorvastatin (LIPITOR) 80 MG tablet, Take 1 tablet (80 mg total) by mouth daily., Disp: 90 tablet, Rfl: 1   blood glucose meter kit and supplies KIT, E11.329; LON 99 months; check FSBS three times a day while on insulin, then once a day off insulin, Disp: 1 each, Rfl: 0   lisinopril (ZESTRIL) 5 MG tablet, Take 1 tablet (5 mg total) by mouth daily., Disp: 90 tablet, Rfl: 1   metFORMIN (GLUCOPHAGE-XR) 500 MG 24 hr tablet, Take 2 tablets (1,000 mg total) by mouth 2 (two) times daily with a meal., Disp: 120 tablet, Rfl: 2   Multiple Vitamin tablet, Take 1 tablet by mouth daily., Disp: , Rfl:    ONE TOUCH ULTRA TEST test strip, USE TO CHECK BLOOD SUGAR THREE TIMES DAILY WHILE ON INSULIN AND EVERY DAY WHILE OFF INSULIN, Disp: 100 each, Rfl: 1   sertraline (ZOLOFT) 50 MG tablet, Take 1 tablet (50 mg total) by mouth daily., Disp: 90 tablet, Rfl: 1  Allergies  Allergen Reactions   Buspar [Buspirone] Other (See Comments)    suicidal ideation   Ozempic (0.25 Or 0.5 Mg-Dose) [Semaglutide(0.25 Or 0.5mg -Dos)]     Pancreatitis    Trulicity [Dulaglutide] Diarrhea   Sulfa Antibiotics Rash     Review of Systems  All other systems reviewed and are negative.    Objective  Vitals:   10/14/22 1444  BP: 120/62  Pulse: 94  Resp: 16  Temp: 97.9 F (36.6 C)  SpO2: 96%  Weight: 210 lb 4.8 oz (95.4 kg)  Height: 5\' 9"  (1.753 m)    Body mass index is 31.06 kg/m.  Physical Exam Exam conducted with a chaperone present.  Constitutional:      Appearance: Normal  appearance.  HENT:     Head: Normocephalic and atraumatic.  Eyes:     Conjunctiva/sclera: Conjunctivae normal.  Cardiovascular:     Rate and Rhythm: Normal rate and regular rhythm.  Pulmonary:     Effort: Pulmonary effort is normal.     Breath sounds: Normal breath sounds.  Chest:  Breasts:    Right: Normal.     Left: Normal.  Genitourinary:    Comments: External genitalia within normal limits.  Vaginal mucosa pink, moist, normal rugae.  Nonfriable cervix without lesions, no discharge or bleeding noted on speculum exam.  Grade 1 prolapse.  Lymphadenopathy:     Upper Body:     Right upper body: No supraclavicular, axillary or pectoral  adenopathy.     Left upper body: No supraclavicular, axillary or pectoral adenopathy.  Skin:    General: Skin is warm and dry.  Neurological:     General: No focal deficit present.     Mental Status: She is alert. Mental status is at baseline.  Psychiatric:        Mood and Affect: Mood normal.        Behavior: Behavior normal.      Recent Results (from the past 2160 hour(s))  COMPLETE METABOLIC PANEL WITH GFR     Status: Abnormal   Collection Time: 09/13/22  2:58 PM  Result Value Ref Range   Glucose, Bld 150 (H) 65 - 99 mg/dL    Comment: .            Fasting reference interval . For someone without known diabetes, a glucose value >125 mg/dL indicates that they may have diabetes and this should be confirmed with a follow-up test. .    BUN 20 7 - 25 mg/dL   Creat 1.61 0.96 - 0.45 mg/dL   eGFR 68 > OR = 60 WU/JWJ/1.91Y7   BUN/Creatinine Ratio SEE NOTE: 6 - 22 (calc)    Comment:    Not Reported: BUN and Creatinine are within    reference range. .    Sodium 136 135 - 146 mmol/L   Potassium 4.7 3.5 - 5.3 mmol/L   Chloride 101 98 - 110 mmol/L   CO2 22 20 - 32 mmol/L   Calcium 9.8 8.6 - 10.4 mg/dL   Total Protein 7.5 6.1 - 8.1 g/dL   Albumin 4.7 3.6 - 5.1 g/dL   Globulin 2.8 1.9 - 3.7 g/dL (calc)   AG Ratio 1.7 1.0 - 2.5 (calc)    Total Bilirubin 0.7 0.2 - 1.2 mg/dL   Alkaline phosphatase (APISO) 75 37 - 153 U/L   AST 21 10 - 35 U/L   ALT 21 6 - 29 U/L  Lipid Profile     Status: Abnormal   Collection Time: 09/13/22  2:58 PM  Result Value Ref Range   Cholesterol 120 <200 mg/dL   HDL 38 (L) > OR = 50 mg/dL   Triglycerides 829 (H) <150 mg/dL    Comment: . If a non-fasting specimen was collected, consider repeat triglyceride testing on a fasting specimen if clinically indicated.  Perry Mount et al. J. of Clin. Lipidol. 2015;9:129-169. Marland Kitchen    LDL Cholesterol (Calc) 53 mg/dL (calc)    Comment: Reference range: <100 . Desirable range <100 mg/dL for primary prevention;   <70 mg/dL for patients with CHD or diabetic patients  with > or = 2 CHD risk factors. Marland Kitchen LDL-C is now calculated using the Martin-Hopkins  calculation, which is a validated novel method providing  better accuracy than the Friedewald equation in the  estimation of LDL-C.  Horald Pollen et al. Lenox Ahr. 5621;308(65): 2061-2068  (http://education.[facility].com/faq/FAQ164)    Total CHOL/HDL Ratio 3.2 <5.0 (calc)   Non-HDL Cholesterol (Calc) 82 <784 mg/dL (calc)    Comment: For patients with diabetes plus 1 major ASCVD risk  factor, treating to a non-HDL-C goal of <100 mg/dL  (LDL-C of <69 mg/dL) is considered a therapeutic  option.   CBC w/Diff/Platelet     Status: Abnormal   Collection Time: 09/13/22  2:58 PM  Result Value Ref Range   WBC 8.2 3.8 - 10.8 Thousand/uL   RBC 4.26 3.80 - 5.10 Million/uL   Hemoglobin 12.9 11.7 - 15.5 g/dL   HCT 62.9 52.8 -  45.0 %   MCV 89.7 80.0 - 100.0 fL   MCH 30.3 27.0 - 33.0 pg   MCHC 33.8 32.0 - 36.0 g/dL   RDW 16.1 09.6 - 04.5 %   Platelets 247 140 - 400 Thousand/uL   MPV 10.2 7.5 - 12.5 fL   Neutro Abs 4,436 1,500 - 7,800 cells/uL   Lymphs Abs 2,247 850 - 3,900 cells/uL   Absolute Monocytes 525 200 - 950 cells/uL   Eosinophils Absolute 902 (H) 15 - 500 cells/uL   Basophils Absolute 90 0 - 200 cells/uL    Neutrophils Relative % 54.1 %   Total Lymphocyte 27.4 %   Monocytes Relative 6.4 %   Eosinophils Relative 11.0 %   Basophils Relative 1.1 %  Hemoglobin A1c     Status: Abnormal   Collection Time: 09/13/22  2:58 PM  Result Value Ref Range   Hgb A1c MFr Bld 8.6 (H) <5.7 % of total Hgb    Comment: For someone without known diabetes, a hemoglobin A1c value of 6.5% or greater indicates that they may have  diabetes and this should be confirmed with a follow-up  test. . For someone with known diabetes, a value <7% indicates  that their diabetes is well controlled and a value  greater than or equal to 7% indicates suboptimal  control. A1c targets should be individualized based on  duration of diabetes, age, comorbid conditions, and  other considerations. . Currently, no consensus exists regarding use of hemoglobin A1c for diagnosis of diabetes for children. .    Mean Plasma Glucose 200 mg/dL   eAG (mmol/L) 40.9 mmol/L    Comment: . This test was performed on the Roche cobas c503 platform. Effective 01/03/22, a change in test platforms from the Abbott Architect to the Roche cobas c503 may have shifted HbA1c results compared to historical results. Based on laboratory validation testing conducted at [facility], the Roche platform relative to the Abbott platform had an average increase in HbA1c value of < or = 0.3%. This difference is within accepted  variability established by the [facility]. Note that not all individuals will have had a shift in their results and direct comparisons between historical and current results for testing conducted on different platforms is not recommended.      Fall Risk:    10/14/2022    2:49 PM 09/13/2022    2:00 PM 11/07/2018    9:19 AM 10/04/2017    9:06 AM 09/11/2017    8:58 AM  Fall Risk   Falls in the past year? 0 0 0 No No  Number falls in past yr: 0 0 0    Injury with Fall? 0 0 0       Functional Status  Survey: Is the patient deaf or have difficulty hearing?: No Does the patient have difficulty seeing, even when wearing glasses/contacts?: No Does the patient have difficulty concentrating, remembering, or making decisions?: No Does the patient have difficulty walking or climbing stairs?: No Does the patient have difficulty dressing or bathing?: No Does the patient have difficulty doing errands alone such as visiting a doctor's office or shopping?: No   Assessment & Plan  1. Annual physical exam/Screening for cervical cancer: Pap today.  - Cytology - PAP  2. 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 Continue Metformin 1000  mg BID. Discussed triglycerides as well, patient would rather work on diabetes and recheck lipids in 6 months.   - empagliflozin (JARDIANCE) 10 MG TABS tablet; Take 1 tablet (10 mg total) by mouth daily before breakfast.  Dispense: 90 tablet; Refill: 0   -USPSTF grade A and B recommendations reviewed with patient; age-appropriate recommendations, preventive care, screening tests, etc discussed and encouraged; healthy living encouraged; see AVS for patient education given to patient -Discussed importance of 150 minutes of physical activity weekly, eat two servings of fish weekly, eat one serving of tree nuts ( cashews, pistachios, pecans, almonds.Marland Kitchen) every other day, eat 6 servings of fruit/vegetables daily and drink plenty of water and avoid sweet beverages.   -Reviewed Health Maintenance: Yes.

## 2022-10-14 ENCOUNTER — Other Ambulatory Visit (HOSPITAL_COMMUNITY)
Admission: RE | Admit: 2022-10-14 | Discharge: 2022-10-14 | Disposition: A | Discharge status: Home / Self Care | Payer: 59 | Source: Ambulatory Visit | Attending: Internal Medicine | Admitting: Internal Medicine

## 2022-10-14 ENCOUNTER — Encounter: Payer: Self-pay | Admitting: Internal Medicine

## 2022-10-14 ENCOUNTER — Ambulatory Visit (INDEPENDENT_AMBULATORY_CARE_PROVIDER_SITE_OTHER): Payer: 59 | Admitting: Internal Medicine

## 2022-10-14 VITALS — BP 120/62 | HR 94 | Temp 97.9°F | Resp 16 | Ht 69.0 in | Wt 210.3 lb

## 2022-10-14 DIAGNOSIS — Z124 Encounter for screening for malignant neoplasm of cervix: Secondary | ICD-10-CM

## 2022-10-14 DIAGNOSIS — Z0000 Encounter for general adult medical examination without abnormal findings: Secondary | ICD-10-CM

## 2022-10-14 DIAGNOSIS — E11319 Type 2 diabetes mellitus with unspecified diabetic retinopathy without macular edema: Secondary | ICD-10-CM

## 2022-10-14 MED ORDER — EMPAGLIFLOZIN 10 MG PO TABS
10.0000 mg | ORAL_TABLET | Freq: Every day | ORAL | 0 refills | Status: DC
Start: 2022-10-14 — End: 2022-11-23

## 2022-10-18 LAB — CYTOLOGY - PAP
Comment: NEGATIVE
Diagnosis: NEGATIVE
High risk HPV: NEGATIVE

## 2022-10-27 ENCOUNTER — Encounter: Payer: Self-pay | Admitting: Internal Medicine

## 2022-10-30 ENCOUNTER — Encounter: Payer: Self-pay | Admitting: Internal Medicine

## 2022-10-30 NOTE — Progress Notes (Unsigned)
   Acute Office Visit  Subjective:     Patient ID: Breanna Rogers, female    DOB: 1965-12-27, 57 y.o.   MRN: [id number]  No chief complaint on file.   HPI Patient is in today for abomdinal complaint.   Abdominal Complaint:  -Duration: {Blank single:19197::"chronic","days","weeks","months"} -Frequency: {Blank single:19197::"constant","intermittent","occasional","rare","every few minutes","a few times a hour","a few times a day","a few times a week","a few times a month","a few times a year"} -Nature: {Blank multiple:19196::"bloating","sharp","dull","aching","burning","cramping","ill-defined","itchy","pressure-like","pulling","shooting","sore","stabbing","tender","tearing","throbbing"} -Location: {Blank multiple:19196::"diffuse","vague","LUQ","RUQ","epigastric","peri-umbilical","LLQ","RLQ","diffuse","suprapubic". "lower abdominal quadrants"}  -Severity: {Blank single:19197::"mild","moderate","severe","1/10","2/10","3/10","4/10","5/10","6/10","7/10","8/10","9/10","10/10"}  -Radiation: {Blank single:19197::"yes","no"} -Alleviating factors: *** -Aggravating factors: *** -Treatments attempted: {Blank multiple:19196::"none","antacids","PPI","H2 Blocker","laxatives"} -Constipation: {Blank single:19197::"yes","no","intermittent"} -Diarrhea: {Blank single:19197::"yes","no"} -Episodes of diarrhea/day: -Mucous in the stool: {Blank single:19197::"yes","no"} -Heartburn: {Blank single:19197::"yes","no"} -Bloating:{Blank single:19197::"yes","no"} -Passing Gas: {Blank single:19197::"yes","no"} -Nausea: {Blank single:19197::"yes","no"} -Vomiting: {Blank single:19197::"yes","no"} -Episodes of vomit/day: -Melena or hematochezia: {Blank single:19197::"yes","no"} -Rash: {Blank single:19197::"yes","no"} -Jaundice: {Blank single:19197::"yes","no"} -Fever: {Blank single:19197::"yes","no"} -Weight loss: {Blank single:19197::"yes","no"} -Change in Appetite: {Blank single:19197::"yes","no"}   ROS       Objective:    There were no vitals taken for this visit. {Vitals History (Optional):23777}  Physical Exam  No results found for any visits on 10/31/22.      Assessment & Plan:   Problem List Items Addressed This Visit   None   No orders of the defined types were placed in this encounter.   No follow-ups on file.  Margarita Mail, DO

## 2022-10-31 ENCOUNTER — Ambulatory Visit: Payer: 59 | Admitting: Internal Medicine

## 2022-10-31 ENCOUNTER — Encounter: Payer: Self-pay | Admitting: Internal Medicine

## 2022-10-31 VITALS — BP 108/64 | HR 94 | Temp 98.3°F | Resp 16 | Ht 69.0 in | Wt 207.6 lb

## 2022-10-31 DIAGNOSIS — Z0289 Encounter for other administrative examinations: Secondary | ICD-10-CM | POA: Diagnosis not present

## 2022-10-31 DIAGNOSIS — R197 Diarrhea, unspecified: Secondary | ICD-10-CM | POA: Diagnosis not present

## 2022-10-31 DIAGNOSIS — Z7984 Long term (current) use of oral hypoglycemic drugs: Secondary | ICD-10-CM

## 2022-10-31 DIAGNOSIS — E11319 Type 2 diabetes mellitus with unspecified diabetic retinopathy without macular edema: Secondary | ICD-10-CM | POA: Diagnosis not present

## 2022-10-31 NOTE — Patient Instructions (Addendum)
It was great seeing you today!  Plan discussed at today's visit: -Blood work ordered today, results will be uploaded to MyChart.  -Stool cultures today -Try low FODMAP diet -Decrease Metformin to 500 mg twice a day   Follow up in: already scheduled  Take care and let us know if you have any questions or concerns prior to your next visit.  Dr. Caralee Ates  Low-FODMAP Eating Plan  FODMAP stands for fermentable oligosaccharides, disaccharides, monosaccharides, and polyols. These are sugars that are hard for some people to digest. A low-FODMAP eating plan may help some people who have irritable bowel syndrome (IBS) and certain other bowel (intestinal) diseases to manage their symptoms. This meal plan can be complicated to follow. Work with a diet and nutrition specialist (dietitian) to make a low-FODMAP eating plan that is right for you. A dietitian can help make sure that you get enough nutrition from this diet. What are tips for following this plan? Reading food labels Check labels for hidden FODMAPs such as: High-fructose syrup. Honey. Agave. Natural fruit flavors. Onion or garlic powder. Choose low-FODMAP foods that contain 3-4 grams of fiber per serving. Check food labels for serving sizes. Eat only one serving at a time to make sure FODMAP levels stay low. Shopping Shop with a list of foods that are recommended on this diet and make a meal plan. Meal planning Follow a low-FODMAP eating plan for up to 6 weeks, or as told by your health care provider or dietitian. To follow the eating plan: Eliminate high-FODMAP foods from your diet completely. Choose only low-FODMAP foods to eat. You will do this for 2-6 weeks. Gradually reintroduce high-FODMAP foods into your diet one at a time. Most people should wait a few days before introducing the next new high-FODMAP food into their meal plan. Your dietitian can recommend how quickly you may reintroduce foods. Keep a daily record of what and  how much you eat and drink. Make note of any symptoms that you have after eating. Review your daily record with a dietitian regularly to identify which foods you can eat and which foods you should avoid. General tips Drink enough fluid each day to keep your urine pale yellow. Avoid processed foods. These often have added sugar and may be high in FODMAPs. Avoid most dairy products, whole grains, and sweeteners. Work with a dietitian to make sure you get enough fiber in your diet. Avoid high FODMAP foods at meals to manage symptoms. Recommended foods Fruits Bananas, oranges, tangerines, lemons, limes, blueberries, raspberries, strawberries, grapes, cantaloupe, honeydew melon, kiwi, papaya, passion fruit, and pineapple. Limited amounts of dried cranberries, banana chips, and shredded coconut. Vegetables Eggplant, zucchini, cucumber, peppers, green beans, bean sprouts, lettuce, arugula, kale, Swiss chard, spinach, collard greens, bok choy, summer squash, potato, and tomato. Limited amounts of corn, carrot, and sweet potato. Green parts of scallions. Grains Gluten-free grains, such as rice, oats, buckwheat, quinoa, corn, polenta, and millet. Gluten-free pasta, bread, or cereal. Rice noodles. Corn tortillas. Meats and other proteins Unseasoned beef, pork, poultry, or fish. Eggs. Tomasa Blase. Tofu (firm) and tempeh. Limited amounts of nuts and seeds, such as almonds, walnuts, [place] nuts, pecans, peanuts, nut butters, pumpkin seeds, chia seeds, and sunflower seeds. Dairy Lactose-free milk, yogurt, and kefir. Lactose-free cottage cheese and ice cream. Non-dairy milks, such as almond, coconut, hemp, and rice milk. Non-dairy yogurt. Limited amounts of goat cheese, brie, mozzarella, parmesan, swiss, and other hard cheeses. Fats and oils Butter-free spreads. Vegetable oils, such as olive, canola, and  sunflower oil. Seasoning and other foods Artificial sweeteners with names that do not end in "ol," such as  aspartame, saccharine, and stevia. Maple syrup, white table sugar, raw sugar, brown sugar, and molasses. Mayonnaise, soy sauce, and tamari. Fresh basil, coriander, parsley, rosemary, and thyme. Beverages Water and mineral water. Sugar-sweetened soft drinks. Small amounts of orange juice or cranberry juice. Black and green tea. Most dry wines. Coffee. The items listed above may not be a complete list of foods and beverages you can eat. Contact a dietitian for more information. Foods to avoid Fruits Fresh, dried, and juiced forms of apple, pear, watermelon, peach, plum, cherries, apricots, blackberries, boysenberries, figs, nectarines, and mango. Avocado. Vegetables Chicory root, artichoke, asparagus, cabbage, snow peas, Brussels sprouts, broccoli, sugar snap peas, mushrooms, celery, and cauliflower. Onions, garlic, leeks, and the white part of scallions. Grains Wheat, including kamut, durum, and semolina. Barley and bulgur. Couscous. Wheat-based cereals. Wheat noodles, bread, crackers, and pastries. Meats and other proteins Fried or fatty meat. Sausage. Cashews and pistachios. Soybeans, baked beans, black beans, chickpeas, kidney beans, fava beans, navy beans, lentils, black-eyed peas, and split peas. Dairy Milk, yogurt, ice cream, and soft cheese. Cream and sour cream. Milk-based sauces. Custard. Buttermilk. Soy milk. Seasoning and other foods Any sugar-free gum or candy. Foods that contain artificial sweeteners such as sorbitol, mannitol, isomalt, or xylitol. Foods that contain honey, high-fructose corn syrup, or agave. Bouillon, vegetable stock, beef stock, and chicken stock. Garlic and onion powder. Condiments made with onion, such as hummus, chutney, pickles, relish, salad dressing, and salsa. Tomato paste. Beverages Chicory-based drinks. Coffee substitutes. Chamomile tea. Fennel tea. Sweet or fortified wines such as port or sherry. Diet soft drinks made with isomalt, mannitol, maltitol,  sorbitol, or xylitol. Apple, pear, and mango juice. Juices with high-fructose corn syrup. The items listed above may not be a complete list of foods and beverages you should avoid. Contact a dietitian for more information. Summary FODMAP stands for fermentable oligosaccharides, disaccharides, monosaccharides, and polyols. These are sugars that are hard for some people to digest. A low-FODMAP eating plan is a short-term diet that helps to ease symptoms of certain bowel diseases. The eating plan usually lasts up to 6 weeks. After that, high-FODMAP foods are reintroduced gradually and one at a time. This can help you find out which foods may be causing symptoms. A low-FODMAP eating plan can be complicated. It is best to work with a dietitian who has experience with this type of plan. This information is not intended to replace advice given to you by your health care provider. Make sure you discuss any questions you have with your health care provider. Document Revised: 08/01/2019 Document Reviewed: 08/01/2019 Elsevier Patient Education  2024 [employer].

## 2022-11-07 ENCOUNTER — Telehealth: Payer: Self-pay | Admitting: Internal Medicine

## 2022-11-07 NOTE — Telephone Encounter (Signed)
Copied from CRM [id number]. Topic: General - Other >> Nov 07, 2022  3:51 PM Turkey B wrote: Reason for CRM: Dahlia Client from [place] [facility] [place] called n about med form pt sent in, and she has a question about the duration of accomodation. Please call back

## 2022-11-07 NOTE — Telephone Encounter (Signed)
Info given 

## 2022-11-21 ENCOUNTER — Encounter: Payer: Self-pay | Admitting: Internal Medicine

## 2022-11-21 NOTE — Progress Notes (Deleted)
Acute Office Visit  Subjective:     Patient ID: Breanna Rogers, female    DOB: 09/02/1965, 57 y.o.   MRN: [id number]  No chief complaint on file.   HPI Patient is in today for forms regarding ongoing abdominal cramping and diarrhea.  States this has been going on for over a year with alternating diarrhea and constipation.  Diarrhea is much more common for her though.  She will have flares of severe and urgent diarrhea, at worst she will have multiple episodes of diarrhea a day at best about once a week.  This is really affecting her job, as she works at a [employer] accident involving her bowel movements last week at work.  Today she would like to discuss papers for her to work at home.  She denies nausea, vomiting or fevers.  She does have constipation sometimes but not regularly.  Denies blood in the stools.  Eating does not seem to make episodes worse. At LOV, stool cultures were negative, celiac panel negative, calprotectin negative. Discussed IBS and low FODMAP diet.    Review of Systems  Constitutional:  Negative for chills and fever.  Gastrointestinal:  Positive for abdominal pain, constipation and diarrhea. Negative for blood in stool, heartburn, melena, nausea and vomiting.        Objective:    There were no vitals taken for this visit. BP Readings from Last 3 Encounters:  10/31/22 108/64  10/14/22 120/62  09/13/22 122/62   Wt Readings from Last 3 Encounters:  10/31/22 207 lb 9.6 oz (94.2 kg)  10/14/22 210 lb 4.8 oz (95.4 kg)  09/13/22 208 lb 9.6 oz (94.6 kg)      Physical Exam Constitutional:      Appearance: Normal appearance. She is well-developed.  HENT:     Head: Normocephalic and atraumatic.  Eyes:     Conjunctiva/sclera: Conjunctivae normal.  Cardiovascular:     Rate and Rhythm: Normal rate and regular rhythm.  Pulmonary:     Effort: Pulmonary effort is normal.     Breath sounds: Normal breath sounds.  Abdominal:     General: There is no distension.      Palpations: Abdomen is soft.     Tenderness: There is no abdominal tenderness. There is no guarding or rebound.  Skin:    General: Skin is warm and dry.  Neurological:     General: No focal deficit present.     Mental Status: She is alert. Mental status is at baseline.  Psychiatric:        Mood and Affect: Mood normal.        Behavior: Behavior normal.     No results found for any visits on 11/23/22.      Assessment & Plan:   1. Diarrhea, unspecified type: Unlikely to be infectious at this point but will obtain a stool culture and O&P.  Will also check for inflammatory bowel disease with calprotectin.  Will screen for celiac disease.  Did discuss potential diagnosis of IBS, low FODMAP diet printed for the patient to try.  - Stool Culture - Ova and parasite examination - CALPROTECTIN - Celiac Disease Panel  2. 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  Decreased to 500 mg twice daily to see how this affects her symptoms.  - metFORMIN (GLUCOPHAGE-XR) 500 MG 24 hr tablet; Take 1 tablet (  500 mg total) by mouth 2 (two) times daily with a meal.  3. Encounter for completion of form with patient: Due to her ongoing severe and urgent abdominal symptoms and diarrhea, a work form was filled out allowing the patient to work from home 5 days a week.   No follow-ups on file.  Margarita Mail, DO

## 2022-11-23 ENCOUNTER — Encounter: Payer: Self-pay | Admitting: Internal Medicine

## 2022-11-23 ENCOUNTER — Ambulatory Visit: Payer: 59 | Admitting: Internal Medicine

## 2022-11-23 ENCOUNTER — Ambulatory Visit (INDEPENDENT_AMBULATORY_CARE_PROVIDER_SITE_OTHER): Payer: 59 | Admitting: Internal Medicine

## 2022-11-23 VITALS — BP 118/78 | HR 79 | Temp 98.2°F | Wt 205.1 lb

## 2022-11-23 DIAGNOSIS — R197 Diarrhea, unspecified: Secondary | ICD-10-CM | POA: Diagnosis not present

## 2022-11-23 DIAGNOSIS — E11319 Type 2 diabetes mellitus with unspecified diabetic retinopathy without macular edema: Secondary | ICD-10-CM

## 2022-11-23 DIAGNOSIS — Z0289 Encounter for other administrative examinations: Secondary | ICD-10-CM

## 2022-11-23 DIAGNOSIS — Z7984 Long term (current) use of oral hypoglycemic drugs: Secondary | ICD-10-CM

## 2022-11-23 MED ORDER — EMPAGLIFLOZIN 10 MG PO TABS
10.0000 mg | ORAL_TABLET | Freq: Every day | ORAL | 1 refills | Status: DC
Start: 2022-11-23 — End: 2022-11-23

## 2022-11-23 MED ORDER — EMPAGLIFLOZIN 25 MG PO TABS
25.0000 mg | ORAL_TABLET | Freq: Every day | ORAL | 1 refills | Status: DC
Start: 2022-11-23 — End: 2023-06-13

## 2022-11-23 NOTE — Patient Instructions (Addendum)
Please call to schedule lung cancer screening at [phone].

## 2022-11-23 NOTE — Progress Notes (Signed)
Established Office Visit  Subjective:     Patient ID: Breanna Rogers, female    DOB: April 07, 1965, 57 y.o.   MRN: [id number]  Chief Complaint  Patient presents with   paperwork    HPI Patient is in today for forms regarding ongoing abdominal cramping and diarrhea.  Had previously filled out work from home forms for this issue, now needing intermittent leave forms. Was first hospitalized for issue back in July 2023. Is prone to flares of pancreatitis, now having severe episodes of diarrhea. No constipation, no blood in the stools. Stool cultures performed previously negative, celiac panel negative, calprotectin negative. Discussed IBS and low FODMAP diet.   Diabetes, Type 2: -Last A1c 1/24 8.4% -Medications: Metformin 500 mg BID XR, Jardiance 10 mg, Lisinopril 5 mg -Patient is compliant with the above medications and reports no side effects.  -Failed medications: Ozempic caused pancreatitis, Trulicity causes nausea -Checking BG at home: not currently  -Diet: starting to eat more healthy after pancreatitis  -Exercise: walks every day -Eye exam: Last in June 2023, will call to schedule - apparently has a history of retinopathy  -Foot exam: UTD -Microalbumin: UTD 1/24 -Statin: yes -PNA vaccine: UTD -Denies symptoms of hypoglycemia, polyuria, polydipsia, numbness extremities, foot ulcers/trauma.  Review of Systems  Constitutional:  Negative for chills and fever.  Gastrointestinal:  Positive for abdominal pain and diarrhea. Negative for blood in stool, constipation, heartburn, melena, nausea and vomiting.        Objective:    BP 118/78   Pulse 79   Temp 98.2 F (36.8 C)   Wt 205 lb 1.6 oz (93 kg)   SpO2 97%   BMI 30.29 kg/m  BP Readings from Last 3 Encounters:  11/23/22 118/78  10/31/22 108/64  10/14/22 120/62   Wt Readings from Last 3 Encounters:  11/23/22 205 lb 1.6 oz (93 kg)  10/31/22 207 lb 9.6 oz (94.2 kg)  10/14/22 210 lb 4.8 oz (95.4 kg)      Physical  Exam Constitutional:      Appearance: Normal appearance. She is well-developed.  HENT:     Head: Normocephalic and atraumatic.  Eyes:     Conjunctiva/sclera: Conjunctivae normal.  Cardiovascular:     Rate and Rhythm: Normal rate and regular rhythm.  Pulmonary:     Effort: Pulmonary effort is normal.     Breath sounds: Normal breath sounds.  Skin:    General: Skin is warm and dry.  Neurological:     General: No focal deficit present.     Mental Status: She is alert. Mental status is at baseline.  Psychiatric:        Mood and Affect: Mood normal.        Behavior: Behavior normal.     No results found for any visits on 11/23/22.      Assessment & Plan:   1. 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 Will decrease Metformin further to 500 mg XR once daily, add probiotic. Increase Jardiance to 25 mg. Forms filled out for intermittent leave needed for flares/appointments.    - empagliflozin (JARDIANCE) 25 MG TABS tablet; Take 1 tablet (25 mg total) by mouth daily before breakfast.  Dispense: 90 tablet; Refill: 1   Return for already scheduled .  Margarita Mail, DO

## 2022-11-25 ENCOUNTER — Other Ambulatory Visit: Payer: Self-pay | Admitting: *Deleted

## 2022-11-25 DIAGNOSIS — Z87891 Personal history of nicotine dependence: Secondary | ICD-10-CM

## 2022-11-25 DIAGNOSIS — Z122 Encounter for screening for malignant neoplasm of respiratory organs: Secondary | ICD-10-CM

## 2022-11-29 ENCOUNTER — Telehealth: Payer: Self-pay

## 2022-11-29 NOTE — Telephone Encounter (Signed)
Received call from [facility] to verify Jardiance dose change. Advised Breanna Rogers that Jardiance 10 mg was discontinued and dose increased to 25 mg.

## 2022-12-01 ENCOUNTER — Encounter: Payer: Self-pay | Admitting: Internal Medicine

## 2022-12-21 ENCOUNTER — Ambulatory Visit
Admission: RE | Admit: 2022-12-21 | Discharge: 2022-12-21 | Disposition: A | Discharge status: Home / Self Care | Payer: 59 | Source: Ambulatory Visit | Attending: Internal Medicine | Admitting: Internal Medicine

## 2022-12-21 DIAGNOSIS — Z1231 Encounter for screening mammogram for malignant neoplasm of breast: Secondary | ICD-10-CM | POA: Diagnosis present

## 2022-12-26 ENCOUNTER — Inpatient Hospital Stay
Admission: RE | Admit: 2022-12-26 | Discharge: 2022-12-26 | Disposition: A | Discharge status: Home / Self Care | Payer: Self-pay | Source: Ambulatory Visit | Attending: Internal Medicine | Admitting: Internal Medicine

## 2022-12-26 ENCOUNTER — Other Ambulatory Visit: Payer: Self-pay | Admitting: *Deleted

## 2022-12-26 DIAGNOSIS — Z1231 Encounter for screening mammogram for malignant neoplasm of breast: Secondary | ICD-10-CM

## 2022-12-27 ENCOUNTER — Ambulatory Visit: Payer: 59 | Admitting: Internal Medicine

## 2022-12-27 ENCOUNTER — Encounter: Payer: 59 | Admitting: Internal Medicine

## 2023-01-04 ENCOUNTER — Encounter: Payer: Self-pay | Admitting: Adult Health

## 2023-01-04 ENCOUNTER — Ambulatory Visit: Payer: 59 | Admitting: Adult Health

## 2023-01-04 DIAGNOSIS — Z87891 Personal history of nicotine dependence: Secondary | ICD-10-CM | POA: Diagnosis not present

## 2023-01-04 NOTE — Progress Notes (Signed)
  Virtual Visit via Telephone Note  I connected with Breanna Rogers , 01/04/23 10:41 AM by a telemedicine application and verified that I am speaking with the correct person using two identifiers.  Location: Patient: home Provider: home   I discussed the limitations of evaluation and management by telemedicine and the availability of in person appointments. The patient expressed understanding and agreed to proceed.   Shared Decision Making Visit Lung Cancer Screening Program [phone])   Eligibility: 57 y.o. Pack Years Smoking History Calculation = 16yrs (# packs/per year x # years smoked) Recent History of coughing up blood  no Unexplained weight loss? no ( >Than 15 pounds within the last 6 months ) Prior History Lung / other cancer no (Diagnosis within the last 5 years already requiring surveillance chest CT Scans). Smoking Status Former Smoker Former Smokers: Years since quit: 13 years  Quit Date: 2011  Visit Components: Discussion included one or more decision making aids. YES Discussion included risk/benefits of screening. YES Discussion included potential follow up diagnostic testing for abnormal scans. YES Discussion included meaning and risk of over diagnosis. YES Discussion included meaning and risk of False Positives. YES Discussion included meaning of total radiation exposure. YES  Counseling Included: Importance of adherence to annual lung cancer LDCT screening. YES Impact of comorbidities on ability to participate in the program. YES Ability and willingness to under diagnostic treatment. YES  Smoking Cessation Counseling: Former Smokers:  Discussed the importance of maintaining cigarette abstinence. yes Diagnosis Code: Personal History of Nicotine Dependence. A35.573 Information about tobacco cessation classes and interventions provided to patient. Yes Patient provided with "ticket" for LDCT Scan. yes Written Order for Lung Cancer Screening with LDCT placed in  [facility]. Yes (CT Chest Lung Cancer Screening Low Dose W/O CM) [id number]  Z12.2-Screening of respiratory organs Z87.891-Personal history of nicotine dependence   [name] 01/04/23

## 2023-01-04 NOTE — Patient Instructions (Signed)

## 2023-01-09 ENCOUNTER — Ambulatory Visit
Admission: RE | Admit: 2023-01-09 | Discharge: 2023-01-09 | Disposition: A | Discharge status: Home / Self Care | Payer: 59 | Source: Ambulatory Visit | Attending: Acute Care | Admitting: Acute Care

## 2023-01-09 ENCOUNTER — Encounter: Payer: Self-pay | Admitting: Internal Medicine

## 2023-01-09 ENCOUNTER — Ambulatory Visit (INDEPENDENT_AMBULATORY_CARE_PROVIDER_SITE_OTHER): Payer: 59 | Admitting: Internal Medicine

## 2023-01-09 VITALS — BP 126/68 | HR 93 | Resp 16 | Ht 69.0 in | Wt 204.0 lb

## 2023-01-09 DIAGNOSIS — K580 Irritable bowel syndrome with diarrhea: Secondary | ICD-10-CM

## 2023-01-09 DIAGNOSIS — Z87891 Personal history of nicotine dependence: Secondary | ICD-10-CM

## 2023-01-09 DIAGNOSIS — Z122 Encounter for screening for malignant neoplasm of respiratory organs: Secondary | ICD-10-CM

## 2023-01-09 DIAGNOSIS — E11319 Type 2 diabetes mellitus with unspecified diabetic retinopathy without macular edema: Secondary | ICD-10-CM | POA: Diagnosis not present

## 2023-01-09 LAB — POCT GLYCOSYLATED HEMOGLOBIN (HGB A1C): Hemoglobin A1C: 7.7 % — AB (ref 4.0–5.6)

## 2023-01-09 LAB — HM DIABETES EYE EXAM

## 2023-01-09 NOTE — Progress Notes (Signed)
Established Office Visit  Subjective:     Patient ID: Breanna Rogers, female    DOB: 1965-07-12, 57 y.o.   MRN: [id number]  Chief Complaint  Patient presents with   Follow-up    HPI Patient is in today for follow-up on chronic medical conditions.  IBS: -GI symptoms have greatly improved since starting probiotics  Diabetes, Type 2: -Last A1c 6/24 8.6% -Medications: Metformin 500 mg once a day XR, Jardiance increased to 25 mg, Lisinopril 5 mg -Patient is compliant with the above medications and reports no side effects.  -Failed medications: Ozempic caused pancreatitis, Trulicity causes nausea -Checking BG at home: fasting 160.  -Diet: starting to eat more healthy after pancreatitis  -Exercise: walks every day -Eye exam: UTD 10/24 - retinopathy, following with ophthalmology -Foot exam: UTD -Microalbumin: UTD 1/24 -Statin: yes -PNA vaccine: UTD -Denies symptoms of hypoglycemia, polyuria, polydipsia, numbness extremities, foot ulcers/trauma.  Health maintenance: -Blood work up-to-date -Lung cancer screening earlier today -Mammogram up-to-date 9/24 -Declines flu vaccine today  Review of Systems  Constitutional:  Negative for chills and fever.  Gastrointestinal:  Negative for abdominal pain, blood in stool, constipation, diarrhea, heartburn, melena, nausea and vomiting.        Objective:    BP 126/68   Pulse 93   Resp 16   Ht 5\' 9"  (1.753 m)   Wt 204 lb (92.5 kg)   SpO2 99%   BMI 30.13 kg/m  BP Readings from Last 3 Encounters:  01/09/23 126/68  11/23/22 118/78  10/31/22 108/64   Wt Readings from Last 3 Encounters:  01/09/23 204 lb (92.5 kg)  11/23/22 205 lb 1.6 oz (93 kg)  10/31/22 207 lb 9.6 oz (94.2 kg)      Physical Exam Constitutional:      Appearance: Normal appearance. She is well-developed.  HENT:     Head: Normocephalic and atraumatic.  Eyes:     Conjunctiva/sclera: Conjunctivae normal.  Cardiovascular:     Rate and Rhythm: Normal rate and  regular rhythm.  Pulmonary:     Effort: Pulmonary effort is normal.     Breath sounds: Normal breath sounds.  Skin:    General: Skin is warm and dry.  Neurological:     General: No focal deficit present.     Mental Status: She is alert. Mental status is at baseline.  Psychiatric:        Mood and Affect: Mood normal.        Behavior: Behavior normal.     Results for orders placed or performed in visit on 01/09/23  POCT HgB A1C  Result Value Ref Range   Hemoglobin A1C 7.7 (A) 4.0 - 5.6 %   HbA1c POC (<> result, manual entry)     HbA1c, POC (prediabetic range)     HbA1c, POC (controlled diabetic range)    Results for orders placed or performed in visit on 01/09/23  HM DIABETES EYE EXAM  Result Value Ref Range   HM Diabetic Eye Exam Retinopathy (A) No Retinopathy        Assessment & Plan:   1. 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  Continue metformin extended release just 500 mg once daily due to GI side effects.  Continue Jardiance 25 mg daily.  Patient does have retinopathy and is following up with ophthalmology.  Follow-up in 6 months for recheck of A1c.  -  POCT HgB A1C  2. Irritable bowel syndrome with diarrhea: Improved with probiotics, continue to monitor.  Return in about 6 months (around 07/10/2023).  Margarita Mail, DO

## 2023-01-25 ENCOUNTER — Encounter: Payer: Self-pay | Admitting: Family Medicine

## 2023-01-25 ENCOUNTER — Other Ambulatory Visit
Admission: RE | Admit: 2023-01-25 | Discharge: 2023-01-25 | Disposition: A | Discharge status: Home / Self Care | Payer: 59 | Source: Home / Self Care | Attending: Family Medicine | Admitting: Family Medicine

## 2023-01-25 ENCOUNTER — Ambulatory Visit
Admission: RE | Admit: 2023-01-25 | Discharge: 2023-01-25 | Disposition: A | Discharge status: Home / Self Care | Payer: 59 | Source: Ambulatory Visit | Attending: Family Medicine | Admitting: Family Medicine

## 2023-01-25 ENCOUNTER — Ambulatory Visit: Payer: 59 | Admitting: Family Medicine

## 2023-01-25 ENCOUNTER — Encounter: Payer: Self-pay | Admitting: Internal Medicine

## 2023-01-25 VITALS — BP 120/72 | HR 77 | Temp 98.2°F | Resp 16 | Ht 69.0 in | Wt 202.7 lb

## 2023-01-25 DIAGNOSIS — R1010 Upper abdominal pain, unspecified: Secondary | ICD-10-CM

## 2023-01-25 DIAGNOSIS — Z8719 Personal history of other diseases of the digestive system: Secondary | ICD-10-CM | POA: Insufficient documentation

## 2023-01-25 DIAGNOSIS — R1011 Right upper quadrant pain: Secondary | ICD-10-CM

## 2023-01-25 DIAGNOSIS — R110 Nausea: Secondary | ICD-10-CM

## 2023-01-25 DIAGNOSIS — E11319 Type 2 diabetes mellitus with unspecified diabetic retinopathy without macular edema: Secondary | ICD-10-CM | POA: Diagnosis not present

## 2023-01-25 LAB — COMPREHENSIVE METABOLIC PANEL
ALT: 22 U/L (ref 0–44)
AST: 19 U/L (ref 15–41)
Albumin: 4.8 g/dL (ref 3.5–5.0)
Alkaline Phosphatase: 73 U/L (ref 38–126)
Anion gap: 8 (ref 5–15)
BUN: 19 mg/dL (ref 6–20)
CO2: 29 mmol/L (ref 22–32)
Calcium: 9.7 mg/dL (ref 8.9–10.3)
Chloride: 101 mmol/L (ref 98–111)
Creatinine, Ser: 0.83 mg/dL (ref 0.44–1.00)
GFR, Estimated: >60 mL/min (ref >60)
Glucose, Bld: 134 mg/dL — ABNORMAL HIGH (ref 70–99)
Potassium: 4.3 mmol/L (ref 3.5–5.1)
Sodium: 138 mmol/L (ref 135–145)
Total Bilirubin: 1 mg/dL (ref 0.3–1.2)
Total Protein: 8.1 g/dL (ref 6.5–8.1)

## 2023-01-25 LAB — CBC WITH DIFFERENTIAL/PLATELET
Abs Immature Granulocytes: 0.03 10*3/uL (ref 0.00–0.07)
Basophils Absolute: 0.1 10*3/uL (ref 0.0–0.1)
Basophils Relative: 1 %
Eosinophils Absolute: 1 10*3/uL — ABNORMAL HIGH (ref 0.0–0.5)
Eosinophils Relative: 12 %
HCT: 41.8 % (ref 36.0–46.0)
Hemoglobin: 13.6 g/dL (ref 12.0–15.0)
Immature Granulocytes: 0 %
Lymphocytes Relative: 22 %
Lymphs Abs: 1.8 10*3/uL (ref 0.7–4.0)
MCH: 29.9 pg (ref 26.0–34.0)
MCHC: 32.5 g/dL (ref 30.0–36.0)
MCV: 91.9 fL (ref 80.0–100.0)
Monocytes Absolute: 0.5 10*3/uL (ref 0.1–1.0)
Monocytes Relative: 7 %
Neutro Abs: 4.8 10*3/uL (ref 1.7–7.7)
Neutrophils Relative %: 58 %
Platelets: 230 10*3/uL (ref 150–400)
RBC: 4.55 MIL/uL (ref 3.87–5.11)
RDW: 13.3 % (ref 11.5–15.5)
WBC: 8.2 10*3/uL (ref 4.0–10.5)
nRBC: 0 % (ref 0.0–0.2)

## 2023-01-25 LAB — LIPASE, BLOOD: Lipase: 130 U/L — ABNORMAL HIGH (ref 11–51)

## 2023-01-25 MED ORDER — OXYCODONE HCL 5 MG PO TABS: 5.0000 mg | ORAL_TABLET | Freq: Four times a day (QID) | ORAL | 0 refills | Status: DC | PRN

## 2023-01-25 MED ORDER — ONDANSETRON 4 MG PO TBDP
4.0000 mg | ORAL_TABLET | Freq: Three times a day (TID) | ORAL | 1 refills | Status: DC | PRN
Start: 2023-01-25 — End: 2023-05-05

## 2023-01-25 NOTE — Progress Notes (Signed)
Patient ID: Breanna Rogers, female    DOB: [birth year], 57 y.o.   MRN: [id number]  PCP: Margarita Mail, DO  Chief Complaint  Patient presents with   Abdominal Pain    R upper side onset for a week, pain getting worst    Subjective:   Breanna Rogers is a 57 y.o. female, presents to clinic with CC of the following:  HPI  Pt presents with abd pain similar to past episode of acute pancreatitis 2018 acute pancreatitis which occurred shortly after starting jardiance with her prior PCP Dr. Sherie Don, med was d/c at the time of hospitalization Of note pt has been on jardiance with current PCP since July and dose recently increased from 10 to 25  She had pancreatitis last year at [facility] after getting on ozempic No abd surgery Pain started 3 d ago, epigastric to RUQ radiates across top of abd to right flank and right lower lungs, severe pain with eating and also breathing, palpation or movement also aggrivates it She can sip drinks/water w/o increase in pain Very nauseous but no vomiting or diarrhea No fever, sweats chills     Lab Results  Component Value Date   HGBA1C 7.7 (A) 01/09/2023     Patient Active Problem List   Diagnosis Date Noted   Eosinophilia 06/01/2018   Polyp of sigmoid colon    Abdominal aortic atherosclerosis (HCC) 09/19/2017   Fusion of spine of cervical region 06/22/2017   Breast calcification, left 05/10/2017   Abnormal ECG 04/28/2017   Hx of acute pancreatitis 06/06/2016   Hyperproteinemia 04/05/2016   Urine test positive for microalbuminuria 07/08/2015   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   Dyslipidemia 06/12/2015   Hx of iron deficiency anemia 05/07/2015   Fecal incontinence 05/07/2015   Diabetes mellitus type 2 with retinopathy (HCC)    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HCC)    Anxiety 10/05/2012   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      Current Outpatient Medications:    Accu-Chek FastClix  Lancets MISC, Check blood sugar 1-3 times daily., Disp: 306 each, Rfl: 1   aspirin EC 81 MG tablet, Take 81 mg by mouth daily., Disp: , Rfl:    atorvastatin (LIPITOR) 80 MG tablet, Take 1 tablet (80 mg total) by mouth daily., Disp: 90 tablet, Rfl: 1   blood glucose meter kit and supplies KIT, E11.329; LON 99 months; check FSBS three times a day while on insulin, then once a day off insulin, Disp: 1 each, Rfl: 0   empagliflozin (JARDIANCE) 25 MG TABS tablet, Take 1 tablet (25 mg total) by mouth daily before breakfast., Disp: 90 tablet, Rfl: 1   lisinopril (ZESTRIL) 5 MG tablet, Take 1 tablet (5 mg total) by mouth daily., Disp: 90 tablet, Rfl: 1   metFORMIN (GLUCOPHAGE-XR) 500 MG 24 hr tablet, Take 1 tablet (500 mg total) by mouth 2 (two) times daily with a meal., Disp: , Rfl:    Multiple Vitamin tablet, Take 1 tablet by mouth daily., Disp: , Rfl:    ONE TOUCH ULTRA TEST test strip, USE TO CHECK BLOOD SUGAR THREE TIMES DAILY WHILE ON INSULIN AND EVERY DAY WHILE OFF INSULIN, Disp: 100 each, Rfl: 1   sertraline (ZOLOFT) 50 MG tablet, Take 1 tablet (50 mg total) by mouth daily., Disp: 90 tablet, Rfl: 1   Allergies  Allergen Reactions   Buspar [Buspirone] Other (See Comments)    suicidal ideation   Jardiance [Empagliflozin] Other (See Comments)  Pancreatitis episodes x 2    Ozempic (0.25 Or 0.5 Mg-Dose) [Semaglutide(0.25 Or 0.5mg -Dos)]     Pancreatitis    Trulicity [Dulaglutide] Diarrhea   Sulfa Antibiotics Rash     Social History   Tobacco Use   Smoking status: Former    Current packs/day: 0.00    Average packs/day: 1 pack/day for 35.0 years (35.0 ttl pk-yrs)    Types: Cigarettes    Start date: 03/29/1975    Quit date: 03/28/2010    Years since quitting: 12.8   Smokeless tobacco: Never  Vaping Use   Vaping status: Never Used  Substance Use Topics   Alcohol use: No   Drug use: No      Chart Review Today: I personally reviewed active problem list, medication list, allergies,  family history, social history, health maintenance, notes from last encounter, lab results, imaging with the patient/caregiver today. Reviewed labs from 2018 pancreatitis episodes in March [facility] ED and [facility] admission [facility] last year July 2023 admission records, labs, imaging reviewed    Review of Systems  Constitutional: Negative.   HENT: Negative.    Eyes: Negative.   Respiratory: Negative.    Cardiovascular: Negative.   Gastrointestinal: Negative.   Endocrine: Negative.   Genitourinary: Negative.   Musculoskeletal: Negative.   Skin: Negative.   Allergic/Immunologic: Negative.   Neurological: Negative.   Hematological: Negative.   Psychiatric/Behavioral: Negative.    All other systems reviewed and are negative.      Objective:   Vitals:   01/25/23 1320  BP: 120/72  Pulse: 77  Resp: 16  Temp: 98.2 F (36.8 C)  TempSrc: Oral  SpO2: 97%  Weight: 202 lb 11.2 oz (91.9 kg)  Height: 5\' 9"  (1.753 m)    Body mass index is 29.93 kg/m.  Physical Exam Vitals and nursing note reviewed.  Constitutional:      General: She is not in acute distress.    Appearance: Normal appearance. She is well-developed. She is obese. She is not ill-appearing, toxic-appearing or diaphoretic.  HENT:     Head: Normocephalic and atraumatic.     Nose: Nose normal.     Mouth/Throat:     Mouth: Mucous membranes are moist.     Pharynx: No pharyngeal swelling or oropharyngeal exudate.  Eyes:     General:        Right eye: No discharge.        Left eye: No discharge.     Conjunctiva/sclera: Conjunctivae normal.  Neck:     Trachea: No tracheal deviation.  Cardiovascular:     Rate and Rhythm: Normal rate and regular rhythm.     Heart sounds: Normal heart sounds.  Pulmonary:     Effort: Pulmonary effort is normal. No respiratory distress.     Breath sounds: No stridor.     Comments: Poor inspiratory effort/splinting, diminished BS b/l at the bases Abdominal:     General: Bowel sounds are normal.      Palpations: Abdomen is soft. There is no mass.     Tenderness: There is abdominal tenderness in the right upper quadrant and epigastric area. There is guarding. There is no right CVA tenderness or left CVA tenderness.  Skin:    General: Skin is warm and dry.     Coloration: Skin is not jaundiced.     Findings: No rash.  Neurological:     Mental Status: She is alert.     Motor: No abnormal muscle tone.     Coordination: Coordination normal.  Psychiatric:        Behavior: Behavior normal.      Results for orders placed or performed in visit on 01/09/23  POCT HgB A1C  Result Value Ref Range   Hemoglobin A1C 7.7 (A) 4.0 - 5.6 %   HbA1c POC (<> result, manual entry)     HbA1c, POC (prediabetic range)     HbA1c, POC (controlled diabetic range)         Assessment & Plan:     ICD-10-CM   1. Pain of upper abdomen  R10.10 CBC with Differential/Platelet    Comprehensive Metabolic Panel (CMET)    Lipase, blood    CT ABDOMEN PELVIS WO CONTRAST   similar to past pancreatitis episodes, once in 2018 and once last year and [facility]    2. Hx of acute pancreatitis  Z87.19 CBC with Differential/Platelet   reviewed chart labs and imaging extensively, no GI managing, may be related to medication SE? jardiance and ozempic    3. 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 Right upper quadrant abdominal pain  R10.11    ttp with guarding, she reports its similar to prior pancreatitis, worse pain with eating x 3 d, sent for stat labs and trying to get stat CT approved     Severe pain, ttp on exam with involuntary guarding to RUQ, ttp to epigastrim with grimace Prior imaging per [facility] showed pancreatitis on CT but also thickened gallbladder and sludge, due to her abd exam stat CT ordered and if unable to get pt may need to go to the ER Stat labs  ordered at [facility]  Will tx with liquid diet, zofran, pain meds Previously labs with pancreatitis showed elevated lipase and no other elevated labs - if there is sig leukocytosis or abnormal LFTS/bili explained to pt that she may also need to go to the ED/hospital if labs concerning  If labs and CT show similar to pancreatitis in the past will try to manage outpt and have her f/up with PCP and GI For now she will hold jardiance Will discuss with PCP      Danelle Berry, PA-C 01/25/23 2:16 PM

## 2023-01-25 NOTE — Addendum Note (Signed)
Addended by: Danelle Berry on: 01/25/2023 02:27 PM   Modules accepted: Orders

## 2023-02-02 ENCOUNTER — Other Ambulatory Visit: Payer: Self-pay

## 2023-02-02 DIAGNOSIS — Z87891 Personal history of nicotine dependence: Secondary | ICD-10-CM

## 2023-02-02 DIAGNOSIS — Z122 Encounter for screening for malignant neoplasm of respiratory organs: Secondary | ICD-10-CM

## 2023-02-15 ENCOUNTER — Encounter: Payer: Self-pay | Admitting: Internal Medicine

## 2023-03-06 ENCOUNTER — Encounter: Payer: Self-pay | Admitting: Internal Medicine

## 2023-03-14 ENCOUNTER — Ambulatory Visit: Payer: 59 | Admitting: Gastroenterology

## 2023-03-27 ENCOUNTER — Encounter: Payer: Self-pay | Admitting: Internal Medicine

## 2023-03-27 ENCOUNTER — Other Ambulatory Visit: Payer: Self-pay | Admitting: Internal Medicine

## 2023-03-27 DIAGNOSIS — K859 Acute pancreatitis without necrosis or infection, unspecified: Secondary | ICD-10-CM

## 2023-03-31 ENCOUNTER — Encounter: Payer: Self-pay | Admitting: Internal Medicine

## 2023-03-31 ENCOUNTER — Other Ambulatory Visit: Payer: Self-pay | Admitting: Internal Medicine

## 2023-03-31 DIAGNOSIS — E785 Hyperlipidemia, unspecified: Secondary | ICD-10-CM

## 2023-03-31 DIAGNOSIS — F419 Anxiety disorder, unspecified: Secondary | ICD-10-CM

## 2023-03-31 DIAGNOSIS — E11319 Type 2 diabetes mellitus with unspecified diabetic retinopathy without macular edema: Secondary | ICD-10-CM

## 2023-04-03 MED ORDER — SERTRALINE HCL 50 MG PO TABS
50.0000 mg | ORAL_TABLET | Freq: Every day | ORAL | 1 refills | Status: DC
Start: 2023-04-03 — End: 2023-05-05

## 2023-04-03 NOTE — Telephone Encounter (Signed)
 Requested Prescriptions  Pending Prescriptions Disp Refills   lisinopril  (ZESTRIL ) 5 MG tablet [Pharmacy Med Name: LISINOPRIL  TAB 5MG ] 90 tablet 1    Sig: TAKE 1 TABLET DAILY     Cardiovascular:  ACE Inhibitors Passed - 04/03/2023  3:46 PM      Passed - Cr in normal range and within 180 days    Creat  Date Value Ref Range Status  09/13/2022 0.98 0.50 - 1.03 mg/dL Final   Creatinine, Ser  Date Value Ref Range Status  01/25/2023 0.83 0.44 - 1.00 mg/dL Final   Creatinine, Urine  Date Value Ref Range Status  03/29/2022 5.0  Final         Passed - K in normal range and within 180 days    Potassium  Date Value Ref Range Status  01/25/2023 4.3 3.5 - 5.1 mmol/L Final  03/26/2013 3.8 3.5 - 5.1 mmol/L Final         Passed - Patient is not pregnant      Passed - Last BP in normal range    BP Readings from Last 1 Encounters:  01/25/23 120/72         Passed - Valid encounter within last 6 months    Recent Outpatient Visits           2 months ago Pain of upper abdomen   [facility] Leavy Mole, PA-C   2 months ago 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, without long-term current use of insulin , unspecified retinopathy severity [facility])   [place] [facility] Bernardo Fend, DO   4 months ago 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, without long-term current use of insulin , unspecified retinopathy severity [facility])   [place] [facility] Bernardo Fend, DO   5 months ago Diarrhea, unspecified type   [facility] Bernardo Fend, DO   5 months ago Annual physical exam   [facility] Bernardo Fend, DO       Future Appointments             In 3 months Bernardo Fend, DO [facility]             atorvastatin  (LIPITOR) 80 MG  tablet [Pharmacy Med Name: ATORVASTATIN  TAB 80MG ] 90 tablet 1    Sig: TAKE 1 TABLET DAILY     Cardiovascular:  Antilipid - Statins Failed - 04/03/2023  3:46 PM      Failed - Lipid Panel in normal range within the last 12 months    Cholesterol, Total  Date Value Ref Range Status  05/07/2015 239 (H) 100 - 199 mg/dL Final   Cholesterol  Date Value Ref Range Status  09/13/2022 120 <200 mg/dL Final   LDL Cholesterol (Calc)  Date Value Ref Range Status  09/13/2022 53 mg/dL (calc) Final    Comment:    Reference range: <100 . Desirable range <100 mg/dL for primary prevention;   <70 mg/dL for patients with CHD or diabetic patients  with > or = 2 CHD risk factors. SABRA LDL-C is now calculated using the Martin-Hopkins  calculation, which is a validated novel method providing  better accuracy than the Friedewald equation in the  estimation of LDL-C.  Gladis APPLETHWAITE et al. SANDREA. 7986;689(80): 2061-2068  (http://education.[employer].com/faq/FAQ164)    HDL  Date Value Ref Range Status  09/13/2022 38 (L) > OR =  50 mg/dL Final  97/90/7982 42 >60 mg/dL Final   Triglycerides  Date Value Ref Range Status  09/13/2022 248 (H) <150 mg/dL Final    Comment:    . If a non-fasting specimen was collected, consider repeat triglyceride testing on a fasting specimen if clinically indicated.  Veatrice et al. J. of Clin. Lipidol. 2015;9:129-169. 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, without long-term current use of insulin , unspecified retinopathy severity [facility])   [place] [facility] Bernardo Fend, DO   4 months ago Type 2 diabetes mellitus with both eyes affected by retinopathy  without macular edema, without long-term current use of insulin , unspecified retinopathy severity [facility])   [facility] Bernardo Fend, DO   5 months ago Diarrhea, unspecified type   [facility] Bernardo Fend, DO   5 months ago Annual physical exam   [facility] Bernardo Fend, DO       Future Appointments             In 3 months Bernardo Fend, DO [facility], [facility]

## 2023-05-02 ENCOUNTER — Encounter: Payer: Self-pay | Admitting: Internal Medicine

## 2023-05-05 ENCOUNTER — Encounter: Payer: Self-pay | Admitting: Internal Medicine

## 2023-05-05 ENCOUNTER — Ambulatory Visit: Payer: 59 | Admitting: Internal Medicine

## 2023-05-05 ENCOUNTER — Other Ambulatory Visit: Payer: Self-pay

## 2023-05-05 VITALS — BP 120/72 | HR 98 | Temp 98.1°F | Resp 16 | Ht 69.0 in | Wt 198.9 lb

## 2023-05-05 DIAGNOSIS — Z8719 Personal history of other diseases of the digestive system: Secondary | ICD-10-CM

## 2023-05-05 DIAGNOSIS — F419 Anxiety disorder, unspecified: Secondary | ICD-10-CM

## 2023-05-05 DIAGNOSIS — Z7984 Long term (current) use of oral hypoglycemic drugs: Secondary | ICD-10-CM

## 2023-05-05 DIAGNOSIS — E11319 Type 2 diabetes mellitus with unspecified diabetic retinopathy without macular edema: Secondary | ICD-10-CM | POA: Diagnosis not present

## 2023-05-05 LAB — POCT GLYCOSYLATED HEMOGLOBIN (HGB A1C): Hemoglobin A1C: 10.4 % — AB (ref 4.0–5.6)

## 2023-05-05 MED ORDER — SERTRALINE HCL 100 MG PO TABS
100.0000 mg | ORAL_TABLET | Freq: Two times a day (BID) | ORAL | 1 refills | Status: DC
Start: 2023-05-05 — End: 2023-06-07

## 2023-05-05 MED ORDER — HYDROXYZINE HCL 10 MG PO TABS
10.0000 mg | ORAL_TABLET | Freq: Every evening | ORAL | 0 refills | Status: DC | PRN
Start: 2023-05-05 — End: 2024-02-19

## 2023-05-05 NOTE — Progress Notes (Signed)
 Established Patient Office Visit  Subjective   Patient ID: Breanna Rogers, female    DOB: May 22, 1965  Age: 58 y.o. MRN: [id number]  Chief Complaint  Patient presents with   Medication Problem    Patient stated she has stopped all diabetic medication for 2 months due to Pancreas.    HPI  Patient is in today for follow-up on chronic medical conditions. She has history of recurrent pancreatis, will have gnawing epigastric pain. Has a CT A/P back in October, pancreas was normal at that time but she states it always is on imaging despite having symptoms and elevated lipase. Lipase in October was 130. Patient was thinking it was her Jardiance , now has stopped all medications and pain has resolved, no pain currently but not on any medications.  IBS: -GI symptoms have greatly improved since starting probiotics  Diabetes, Type 2: -Last A1c 10/24 7.7% -Medications: Metformin  500 mg once a day XR, Jardiance  25 mg, Lisinopril  5 mg - not taking anything right now -Failed medications: Ozempic caused pancreatitis, Trulicity causes nausea -Eye exam: UTD 10/24 - retinopathy, following with ophthalmology -Foot exam: UTD -Microalbumin: Due but patient cannot provide sample, will order it at follow up -Statin: yes -PNA vaccine: UTD -Denies symptoms of hypoglycemia, polyuria, polydipsia, numbness extremities, foot ulcers/trauma.  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   Dyslipidemia  06/12/2015   Hx of iron deficiency anemia 05/07/2015   Fecal incontinence 05/07/2015   Diabetes mellitus type 2 with retinopathy (HCC)    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HCC)    Anxiety 10/05/2012   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   Past Medical History:  Diagnosis Date   Abdominal aortic atherosclerosis (HCC) 09/19/2017   Diabetes mellitus type 2 with retinopathy (HCC)    Elevated transaminase level    Hot flashes    Hyperlipidemia    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HCC) 4/15, 10/15   seen every 6 months   Numbness of foot    Obesity    Pancreatitis    Spasm of muscle    Vitamin D  deficiency    Past Surgical History:  Procedure Laterality Date   CERVICAL FUSION  2013   C5-7 at [facility], (ACDF C5-6 with removal of hardware)   CERVICAL FUSION  07/23/2017   C3-C7   COLONOSCOPY WITH PROPOFOL  N/A 12/15/2017   Procedure: COLONOSCOPY WITH PROPOFOL  with biopsy;  Surgeon: Jinny Carmine, MD;  Location[facility];  Service: Endoscopy;  Laterality: N/A;  Diabetic - oral meds   POLYPECTOMY N/A 12/15/2017   Procedure: POLYPECTOMY;  Surgeon: Jinny Carmine, MD;  Location: [facility];  Service: Endoscopy;  Laterality: N/A;   TONSILLECTOMY AND ADENOIDECTOMY  1976   TUBAL LIGATION  1990   Social History   Tobacco Use   Smoking status: Former    Current packs/day: 0.00    Average packs/day: 1 pack/day for 35.0 years (35.0 ttl pk-yrs)    Types: Cigarettes    Start date: 03/29/1975    Quit date: 03/28/2010  Years since quitting: 13.1   Smokeless tobacco: Never  Vaping Use   Vaping status: Never Used  Substance Use Topics   Alcohol use: No   Drug use: No   Social History   Socioeconomic History   Marital status: Married    Spouse name: Not on file   Number of children: Not on file   Years of education: Not on file   Highest education level: GED or equivalent  Occupational History   Not on file  Tobacco  Use   Smoking status: Former    Current packs/day: 0.00    Average packs/day: 1 pack/day for 35.0 years (35.0 ttl pk-yrs)    Types: Cigarettes    Start date: 03/29/1975    Quit date: 03/28/2010    Years since quitting: 13.1   Smokeless tobacco: Never  Vaping Use   Vaping status: Never Used  Substance and Sexual Activity   Alcohol use: No   Drug use: No   Sexual activity: Not Currently  Other Topics Concern   Not on file  Social History Narrative   Not on file   Social Drivers of Health   Financial Resource Strain: Low Risk  (05/04/2023)   Overall Financial Resource Strain (CARDIA)    Difficulty of Paying Living Expenses: Not hard at all  Food Insecurity: No Food Insecurity (05/04/2023)   Hunger Vital Sign    Worried About Running Out of Food in the Last Year: Never true    Ran Out of Food in the Last Year: Never true  Transportation Needs: No Transportation Needs (05/04/2023)   PRAPARE - Administrator, Civil Service (Medical): No    Lack of Transportation (Non-Medical): No  Physical Activity: Insufficiently Active (05/04/2023)   Exercise Vital Sign    Days of Exercise per Week: 2 days    Minutes of Exercise per Session: 20 min  Stress: Stress Concern Present (05/04/2023)   [employer] of Occupational Health - Occupational Stress Questionnaire    Feeling of Stress : To some extent  Social Connections: Socially Integrated (05/04/2023)   Social Connection and Isolation Panel [NHANES]    Frequency of Communication with Friends and Family: More than three times a week    Frequency of Social Gatherings with Friends and Family: More than three times a week    Attends Religious Services: 1 to 4 times per year    Active Member of [employer] or Organizations: Yes    Attends Banker Meetings: 1 to 4 times per year    Marital Status: Married  Catering Manager Violence: Not At Risk (10/14/2022)   Humiliation, Afraid, Rape, and Kick questionnaire    Fear of Current or  Ex-Partner: No    Emotionally Abused: No    Physically Abused: No    Sexually Abused: No   Family Status  Relation Name Status   Mother  Deceased at age 51       cervical cancer   Father  Deceased at age 20       heart attack   Sister older Alive   Brother oldest Deceased       DM   Sister older Alive   Daughter  Alive   Son  Alive   MGM  Deceased       old age   MGF  Deceased       olda age   PGM  Deceased       old age   PGF  Deceased  heart attack   Sister oldest Deceased       heart attack   Brother  Deceased       MVA   Neg Hx  (Not Specified)  No partnership data on file   Family History  Problem Relation Age of Onset   Cancer Mother        cervical   Asthma Mother    Diabetes Mother    Heart disease Mother    Hyperlipidemia Mother    Heart disease Father    Hyperlipidemia Father    Hypertension Father    Heart attack Father    Thyroid disease Sister    Cancer Sister    Diabetes Brother    Hypertension Brother    Diabetes Sister    Heart attack Paternal Grandfather    Diabetes Sister    Heart disease Sister    Heart attack Sister    COPD Sister    Stroke Neg Hx    Breast cancer Neg Hx    Allergies  Allergen Reactions   Buspar  [Buspirone ] Other (See Comments)    suicidal ideation   Jardiance  [Empagliflozin ] Other (See Comments)    Pancreatitis episodes x 2    Ozempic (0.25 Or 0.5 Mg-Dose) [Semaglutide(0.25 Or 0.5mg -Dos)]     Pancreatitis    Trulicity [Dulaglutide] Diarrhea   Sulfa Antibiotics Rash      Review of Systems  Gastrointestinal:  Negative for abdominal pain, constipation, diarrhea, heartburn, nausea and vomiting.  All other systems reviewed and are negative.     Objective:     BP 120/72 (Cuff Size: Large)   Pulse 98   Temp 98.1 F (36.7 C) (Oral)   Resp 16   Ht 5' 9 (1.753 m)   Wt 198 lb 14.4 oz (90.2 kg)   SpO2 98%   BMI 29.37 kg/m  BP Readings from Last 3 Encounters:  05/05/23 120/72  01/25/23 120/72   01/09/23 126/68   Wt Readings from Last 3 Encounters:  05/05/23 198 lb 14.4 oz (90.2 kg)  01/25/23 202 lb 11.2 oz (91.9 kg)  01/09/23 204 lb (92.5 kg)      Physical Exam Constitutional:      Appearance: Normal appearance.  HENT:     Head: Normocephalic and atraumatic.  Eyes:     Conjunctiva/sclera: Conjunctivae normal.  Cardiovascular:     Rate and Rhythm: Normal rate and regular rhythm.  Pulmonary:     Effort: Pulmonary effort is normal.     Breath sounds: Normal breath sounds.  Skin:    General: Skin is warm and dry.  Neurological:     General: No focal deficit present.     Mental Status: She is alert. Mental status is at baseline.  Psychiatric:        Mood and Affect: Mood normal.        Behavior: Behavior normal.      No results found for any visits on 05/05/23.  Last CBC Lab Results  Component Value Date   WBC 8.2 01/25/2023   HGB 13.6 01/25/2023   HCT 41.8 01/25/2023   MCV 91.9 01/25/2023   MCH 29.9 01/25/2023   RDW 13.3 01/25/2023   PLT 230 01/25/2023   Last metabolic panel Lab Results  Component Value Date   GLUCOSE 134 (H) 01/25/2023   NA 138 01/25/2023   K 4.3 01/25/2023   CL 101 01/25/2023   CO2 29 01/25/2023   BUN 19 01/25/2023   CREATININE 0.83 01/25/2023   GFRNONAA >60  01/25/2023   CALCIUM  9.7 01/25/2023   PHOS 4.1 09/11/2017   PROT 8.1 01/25/2023   ALBUMIN 4.8 01/25/2023   LABGLOB 3.0 05/07/2015   AGRATIO 1.5 05/07/2015   BILITOT 1.0 01/25/2023   ALKPHOS 73 01/25/2023   AST 19 01/25/2023   ALT 22 01/25/2023   ANIONGAP 8 01/25/2023   Last lipids Lab Results  Component Value Date   CHOL 120 09/13/2022   HDL 38 (L) 09/13/2022   LDLCALC 53 09/13/2022   TRIG 248 (H) 09/13/2022   CHOLHDL 3.2 09/13/2022   Last hemoglobin A1c Lab Results  Component Value Date   HGBA1C 10.4 (A) 05/05/2023   Last thyroid functions Lab Results  Component Value Date   TSH 1.48 09/11/2017   Last vitamin D  Lab Results  Component Value Date    VD25OH 37 09/11/2017   Last vitamin B12 and Folate No results found for: VITAMINB12, FOLATE    The ASCVD Risk score (Arnett DK, et al., 2019) failed to calculate for the following reasons:   The valid total cholesterol range is 130 to 320 mg/dL    Assessment & Plan:   1. 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 Discussed mechanism of action of Jardiance  and how it does not work within the pancreas and should not be causing abdominal pain. I think her issues are from the Metformin . Will discontinue Metformin , restart Jardiance  and everything else. Discussed she needs to be strict with her diet and if her A1c is not controlled we will need to proceed with insulin  management. Unable to void, will order microalbumin at follow up.  - POCT HgB A1C  2. Anxiety: Anxiety worse due to husband's health, already on Zoloft  200 mg daily, wanting something as needed. Will avoid benzos and start Hydroxyzine  PRN as she had a bac reaction to Buspar  in the past.   - sertraline  (ZOLOFT ) 100 MG tablet; Take 1 tablet (100 mg total) by mouth 2 (two) times daily.  Dispense: 180 tablet; Refill: 1 - hydrOXYzine  (ATARAX ) 10 MG tablet; Take 1 tablet (10 mg total) by mouth at bedtime as needed for anxiety.  Dispense: 90 tablet; Refill: 0   Return for already scheduled.    Sharyle Fischer, DO

## 2023-05-10 ENCOUNTER — Encounter: Payer: Self-pay | Admitting: Internal Medicine

## 2023-06-07 ENCOUNTER — Encounter: Payer: Self-pay | Admitting: Internal Medicine

## 2023-06-07 DIAGNOSIS — F419 Anxiety disorder, unspecified: Secondary | ICD-10-CM

## 2023-06-07 MED ORDER — SERTRALINE HCL 100 MG PO TABS: 100.0000 mg | ORAL_TABLET | Freq: Two times a day (BID) | ORAL | 1 refills | Status: DC

## 2023-06-12 ENCOUNTER — Other Ambulatory Visit: Payer: Self-pay | Admitting: Internal Medicine

## 2023-06-12 DIAGNOSIS — E11319 Type 2 diabetes mellitus with unspecified diabetic retinopathy without macular edema: Secondary | ICD-10-CM

## 2023-06-13 NOTE — Telephone Encounter (Signed)
 Requested Prescriptions  Pending Prescriptions Disp Refills   JARDIANCE 25 MG TABS tablet [Pharmacy Med Name: JARDIANCE TAB 25MG ] 90 tablet 1    Sig: TAKE 1 TABLET DAILY BEFORE BREAKFAST     Endocrinology:  Diabetes - SGLT2 Inhibitors Failed - 06/13/2023  1:30 PM      Failed - HBA1C is between 0 and 7.9 and within 180 days    Hemoglobin A1C  Date Value Ref Range Status  05/05/2023 10.4 (A) 4.0 - 5.6 % Final   HB A1C (BAYER DCA - WAIVED)  Date Value Ref Range Status  05/07/2015 >14.0 (H) <7.0 % Final    Comment:                                          Diabetic Adult            <7.0                                       Healthy Adult        4.3 - 5.7                                                           (DCCT/NGSP) American Diabetes Association's Summary of Glycemic Recommendations for Adults with Diabetes: Hemoglobin A1c <7.0%. More stringent glycemic goals (A1c <6.0%) may further reduce complications at the cost of increased risk of hypoglycemia.    Hgb A1c MFr Bld  Date Value Ref Range Status  09/13/2022 8.6 (H) <5.7 % of total Hgb Final    Comment:    For someone without known diabetes, a hemoglobin A1c value of 6.5% or greater indicates that they may have  diabetes and this should be confirmed with a follow-up  test. . For someone with known diabetes, a value <7% indicates  that their diabetes is well controlled and a value  greater than or equal to 7% indicates suboptimal  control. A1c targets should be individualized based on  duration of diabetes, age, comorbid conditions, and  other considerations. . Currently, no consensus exists regarding use of hemoglobin A1c for diagnosis of diabetes for children. .          Passed - Cr in normal range and within 360 days    Creat  Date Value Ref Range Status  09/13/2022 0.98 0.50 - 1.03 mg/dL Final   Creatinine, Ser  Date Value Ref Range Status  01/25/2023 0.83 0.44 - 1.00 mg/dL Final   Creatinine, Urine  Date  Value Ref Range Status  03/29/2022 5.0  Final         Passed - eGFR in normal range and within 360 days    GFR, Est African American  Date Value Ref Range Status  11/07/2018 103 > OR = 60 mL/min/1.70m2 Final   GFR, Est Non African American  Date Value Ref Range Status  11/07/2018 89 > OR = 60 mL/min/1.25m2 Final   GFR, Estimated  Date Value Ref Range Status  01/25/2023 >60 >60 mL/min Final    Comment:    (NOTE) Calculated using the CKD-EPI Creatinine Equation (2021)  eGFR  Date Value Ref Range Status  09/13/2022 68 > OR = 60 mL/min/1.60m2 Final         Passed - Valid encounter within last 6 months    Recent Outpatient Visits           4 months ago Pain of upper abdomen   [facility] Danelle Berry, PA-C   5 months ago 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, without long-term current use of insulin, unspecified retinopathy severity [facility])   [place] Margarita Mail, DO   6 months ago 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

## 2023-07-10 ENCOUNTER — Ambulatory Visit: Payer: 59 | Admitting: Internal Medicine

## 2023-07-10 ENCOUNTER — Other Ambulatory Visit: Payer: Self-pay

## 2023-07-10 ENCOUNTER — Encounter: Payer: Self-pay | Admitting: Internal Medicine

## 2023-07-10 VITALS — BP 110/78 | HR 75 | Temp 97.9°F | Resp 16 | Ht 69.0 in | Wt 196.3 lb

## 2023-07-10 DIAGNOSIS — F419 Anxiety disorder, unspecified: Secondary | ICD-10-CM

## 2023-07-10 DIAGNOSIS — E11319 Type 2 diabetes mellitus with unspecified diabetic retinopathy without macular edema: Secondary | ICD-10-CM | POA: Diagnosis not present

## 2023-07-10 DIAGNOSIS — Z7984 Long term (current) use of oral hypoglycemic drugs: Secondary | ICD-10-CM

## 2023-07-10 MED ORDER — BUPROPION HCL ER (XL) 150 MG PO TB24
150.0000 mg | ORAL_TABLET | Freq: Every day | ORAL | 1 refills | Status: DC
Start: 2023-07-10 — End: 2023-08-30

## 2023-07-10 NOTE — Progress Notes (Signed)
 Established Patient Office Visit  Subjective   Patient ID: Breanna Rogers, female    DOB: 03-02-1966  Age: 58 y.o. MRN: [id number]  Chief Complaint  Patient presents with   Medical Management of Chronic Issues    6 month recheck    HPI  Patient is in today for follow-up on chronic medical conditions.   IBS: -GI symptoms have greatly improved since starting probiotics  Diabetes, Type 2: -Last A1c 10.4% 2/25 -Medications: Jardiance 25 mg, Lisinopril 5 mg - tolerating well, no longer having abdominal pain since stopping the Metformin, no issues with the Jardiance -Failed medications: Ozempic caused pancreatitis, Trulicity causes nausea, Metformin causing stomach upset -Eye exam: UTD 10/24 - retinopathy, following with ophthalmology -Foot exam: UTD -Microalbumin: Due but patient cannot provide sample, will order it at follow up -Statin: yes -PNA vaccine: UTD -Denies symptoms of hypoglycemia, polyuria, polydipsia, numbness extremities, foot ulcers/trauma.   MDD: -Mood status: stable -Current treatment: Zoloft 200 mg, Hydroxyzine PRN -Satisfied with current treatment?: no -Duration of current treatment : chronic -Side effects: no Medication compliance: excellent compliance     07/10/2023    8:46 AM 01/25/2023    1:19 PM 01/09/2023    2:38 PM 11/23/2022   10:04 AM 10/31/2022    7:52 AM  Depression screen PHQ 2/9  Decreased Interest 0 0 0 0 0  Down, Depressed, Hopeless 0 0 0 0 0  PHQ - 2 Score 0 0 0 0 0  Altered sleeping  0 0 0 0  Tired, decreased energy  0 0 0 0  Change in appetite  0 0 0 0  Feeling bad or failure about yourself   0 0 0 0  Trouble concentrating  0 0 0 0  Moving slowly or fidgety/restless  0 0 0 0  Suicidal thoughts  0 0 0 0  PHQ-9 Score  0 0 0 0  Difficult doing work/chores  Not difficult at all  Not difficult at all Not difficult at all    Health maintenance: -Blood work up-to-date -Lung cancer screening 10/24, lung-rads 2 -Mammogram up-to-date  9/24  Patient Active Problem List   Diagnosis Date Noted   Eosinophilia 06/01/2018   Polyp of sigmoid colon    Abdominal aortic atherosclerosis (HCC) 09/19/2017   Fusion of spine of cervical region 06/22/2017   Breast calcification, left 05/10/2017   Abnormal ECG 04/28/2017   Hx of acute pancreatitis 06/06/2016   Hyperproteinemia 04/05/2016   Urine test positive for microalbuminuria 07/08/2015   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   Dyslipidemia 06/12/2015   Hx of iron deficiency anemia 05/07/2015   Fecal incontinence 05/07/2015   Diabetes mellitus type 2 with retinopathy (HCC)    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HCC)    Anxiety 10/05/2012   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   Past Medical History:  Diagnosis Date   Abdominal aortic atherosclerosis (HCC) 09/19/2017   Diabetes mellitus type 2 with retinopathy (HCC)    Elevated transaminase level    Hot flashes    Hyperlipidemia    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HCC) 4/15, 10/15   seen every 6 months   Numbness of foot    Obesity    Pancreatitis    Spasm of muscle    Vitamin D deficiency    Past Surgical History:  Procedure Laterality Date   CERVICAL FUSION  2013   C5-7 at [facility], (ACDF C5-6 with removal of hardware)   CERVICAL FUSION  07/23/2017   C3-C7  COLONOSCOPY WITH PROPOFOL N/A 12/15/2017   Procedure: COLONOSCOPY WITH PROPOFOL with biopsy;  Surgeon: Marnee Sink, MD;  Location[facility];  Service: Endoscopy;  Laterality: N/A;  Diabetic - oral meds   POLYPECTOMY N/A 12/15/2017   Procedure: POLYPECTOMY;  Surgeon: Marnee Sink, MD;  Location: [facility];  Service: Endoscopy;  Laterality: N/A;   TONSILLECTOMY AND ADENOIDECTOMY  1976   TUBAL LIGATION  1990   Social History   Tobacco Use   Smoking status: Former    Current packs/day: 0.00    Average packs/day: 1 pack/day for 35.0 years (35.0 ttl pk-yrs)    Types: Cigarettes     Start date: 03/29/1975    Quit date: 03/28/2010    Years since quitting: 13.2   Smokeless tobacco: Never  Vaping Use   Vaping status: Never Used  Substance Use Topics   Alcohol use: No   Drug use: No   Social History   Socioeconomic History   Marital status: Married    Spouse name: Not on file   Number of children: Not on file   Years of education: Not on file   Highest education level: GED or equivalent  Occupational History   Not on file  Tobacco Use   Smoking status: Former    Current packs/day: 0.00    Average packs/day: 1 pack/day for 35.0 years (35.0 ttl pk-yrs)    Types: Cigarettes    Start date: 03/29/1975    Quit date: 03/28/2010    Years since quitting: 13.2   Smokeless tobacco: Never  Vaping Use   Vaping status: Never Used  Substance and Sexual Activity   Alcohol use: No   Drug use: No   Sexual activity: Not Currently  Other Topics Concern   Not on file  Social History Narrative   Not on file   Social Drivers of Health   Financial Resource Strain: Low Risk  (05/04/2023)   Overall Financial Resource Strain (CARDIA)    Difficulty of Paying Living Expenses: Not hard at all  Food Insecurity: No Food Insecurity (05/04/2023)   Hunger Vital Sign    Worried About Running Out of Food in the Last Year: Never true    Ran Out of Food in the Last Year: Never true  Transportation Needs: No Transportation Needs (05/04/2023)   PRAPARE - Administrator, Civil Service (Medical): No    Lack of Transportation (Non-Medical): No  Physical Activity: Insufficiently Active (05/04/2023)   Exercise Vital Sign    Days of Exercise per Week: 2 days    Minutes of Exercise per Session: 20 min  Stress: Stress Concern Present (05/04/2023)   [employer] of Occupational Health - Occupational Stress Questionnaire    Feeling of Stress : To some extent  Social Connections: Socially Integrated (05/04/2023)   Social Connection and Isolation Panel [NHANES]    Frequency of Communication  with Friends and Family: More than three times a week    Frequency of Social Gatherings with Friends and Family: More than three times a week    Attends Religious Services: 1 to 4 times per year    Active Member of [employer] or Organizations: Yes    Attends Banker Meetings: 1 to 4 times per year    Marital Status: Married  Catering manager Violence: Not At Risk (10/14/2022)   Humiliation, Afraid, Rape, and Kick questionnaire    Fear of Current or Ex-Partner: No    Emotionally Abused: No    Physically Abused:  No    Sexually Abused: No   Family Status  Relation Name Status   Mother  Deceased at age 63       cervical cancer   Father  Deceased at age 29       heart attack   Sister older Alive   Brother oldest Deceased       DM   Sister older Alive   Daughter  Alive   Son  Alive   MGM  Deceased       old age   MGF  Deceased       olda age   PGM  Deceased       old age   PGF  Deceased       heart attack   Sister oldest Deceased       heart attack   Brother  Deceased       MVA   Neg Hx  (Not Specified)  No partnership data on file   Family History  Problem Relation Age of Onset   Cancer Mother        cervical   Asthma Mother    Diabetes Mother    Heart disease Mother    Hyperlipidemia Mother    Heart disease Father    Hyperlipidemia Father    Hypertension Father    Heart attack Father    Thyroid disease Sister    Cancer Sister    Diabetes Brother    Hypertension Brother    Diabetes Sister    Heart attack Paternal Grandfather    Diabetes Sister    Heart disease Sister    Heart attack Sister    COPD Sister    Stroke Neg Hx    Breast cancer Neg Hx    Allergies  Allergen Reactions   Buspar [Buspirone] Other (See Comments)    suicidal ideation   Jardiance [Empagliflozin] Other (See Comments)    Pancreatitis episodes x 2    Ozempic (0.25 Or 0.5 Mg-Dose) [Semaglutide(0.25 Or 0.5mg -Dos)]     Pancreatitis    Trulicity [Dulaglutide] Diarrhea   Sulfa  Antibiotics Rash      Review of Systems  Gastrointestinal:  Negative for abdominal pain, constipation, diarrhea, heartburn, nausea and vomiting.  Psychiatric/Behavioral:  The patient is nervous/anxious.   All other systems reviewed and are negative.     Objective:     BP 110/78 (Cuff Size: Large)   Pulse 75   Temp 97.9 F (36.6 C) (Oral)   Resp 16   Ht 5\' 9"  (1.753 m)   Wt 196 lb 4.8 oz (89 kg)   SpO2 95%   BMI 28.99 kg/m  BP Readings from Last 3 Encounters:  07/10/23 110/78  05/05/23 120/72  01/25/23 120/72   Wt Readings from Last 3 Encounters:  07/10/23 196 lb 4.8 oz (89 kg)  05/05/23 198 lb 14.4 oz (90.2 kg)  01/25/23 202 lb 11.2 oz (91.9 kg)      Physical Exam Constitutional:      Appearance: Normal appearance.  HENT:     Head: Normocephalic and atraumatic.  Eyes:     Conjunctiva/sclera: Conjunctivae normal.  Cardiovascular:     Rate and Rhythm: Normal rate and regular rhythm.  Pulmonary:     Effort: Pulmonary effort is normal.     Breath sounds: Normal breath sounds.  Skin:    General: Skin is warm and dry.  Neurological:     General: No focal deficit present.     Mental Status: She  is alert. Mental status is at baseline.  Psychiatric:        Mood and Affect: Mood normal.        Behavior: Behavior normal.      No results found for any visits on 07/10/23.  Last CBC Lab Results  Component Value Date   WBC 8.2 01/25/2023   HGB 13.6 01/25/2023   HCT 41.8 01/25/2023   MCV 91.9 01/25/2023   MCH 29.9 01/25/2023   RDW 13.3 01/25/2023   PLT 230 01/25/2023   Last metabolic panel Lab Results  Component Value Date   GLUCOSE 134 (H) 01/25/2023   NA 138 01/25/2023   K 4.3 01/25/2023   CL 101 01/25/2023   CO2 29 01/25/2023   BUN 19 01/25/2023   CREATININE 0.83 01/25/2023   GFRNONAA >60 01/25/2023   CALCIUM 9.7 01/25/2023   PHOS 4.1 09/11/2017   PROT 8.1 01/25/2023   ALBUMIN 4.8 01/25/2023   LABGLOB 3.0 05/07/2015   AGRATIO 1.5 05/07/2015    BILITOT 1.0 01/25/2023   ALKPHOS 73 01/25/2023   AST 19 01/25/2023   ALT 22 01/25/2023   ANIONGAP 8 01/25/2023   Last lipids Lab Results  Component Value Date   CHOL 120 09/13/2022   HDL 38 (L) 09/13/2022   LDLCALC 53 09/13/2022   TRIG 248 (H) 09/13/2022   CHOLHDL 3.2 09/13/2022   Last hemoglobin A1c Lab Results  Component Value Date   HGBA1C 10.4 (A) 05/05/2023   Last thyroid functions Lab Results  Component Value Date   TSH 1.48 09/11/2017   Last vitamin D Lab Results  Component Value Date   VD25OH 37 09/11/2017   Last vitamin B12 and Folate No results found for: "VITAMINB12", "FOLATE"    The ASCVD Risk score (Arnett DK, et al., 2019) failed to calculate for the following reasons:   The valid total cholesterol range is 130 to 320 mg/dL    Assessment & Plan:   Assessment & Plan Depression/Anxiety: Zoloft 200 mg insufficient. Discussed augmentation with Wellbutrin due to minimal side effects and weight neutrality. Comfortable restarting Wellbutrin. Cautioned about potential side effects and alcohol use. - Prescribe Wellbutrin XL 150 mg once daily in the morning. - Monitor for side effects such as insomnia and initial adjustment symptoms. - Follow up in 4-6 weeks to assess effectiveness and discuss further treatment options.  Type 2 Diabetes Mellitus Jardiance 25 mg managing well. Metformin discontinued due to gastrointestinal issues. Jardiance preferred. - Continue Jardiance 25 mg daily. - Schedule A1c test in May, at least 3 months after the last test. - Perform urine test for diabetes management at the next visit.  Tendonitis Tendonitis in both hands affecting daily activities. Challenging management due to ambidextrous use. Just had 2 steroid injections.  - Discuss potential ergonomic adjustments to reduce strain on hands. - Consider referral to physical therapy if symptoms persist.   Return in about 6 weeks (around 08/21/2023).    Rockney Cid,  DO

## 2023-08-18 ENCOUNTER — Ambulatory Visit: Admitting: Internal Medicine

## 2023-08-26 ENCOUNTER — Other Ambulatory Visit: Payer: Self-pay | Admitting: Internal Medicine

## 2023-08-26 DIAGNOSIS — E11319 Type 2 diabetes mellitus with unspecified diabetic retinopathy without macular edema: Secondary | ICD-10-CM

## 2023-08-28 NOTE — Telephone Encounter (Signed)
 OV 07/10/23 Requested Prescriptions  Pending Prescriptions Disp Refills   lisinopril  (ZESTRIL ) 5 MG tablet [Pharmacy Med Name: LISINOPRIL  TAB 5MG ] 90 tablet 1    Sig: TAKE 1 TABLET DAILY     Cardiovascular:  ACE Inhibitors Failed - 08/28/2023 12:56 PM      Failed - Cr in normal range and within 180 days    Creat  Date Value Ref Range Status  09/13/2022 0.98 0.50 - 1.03 mg/dL Final   Creatinine, Ser  Date Value Ref Range Status  01/25/2023 0.83 0.44 - 1.00 mg/dL Final   Creatinine, Urine  Date Value Ref Range Status  03/29/2022 5.0  Final         Failed - K in normal range and within 180 days    Potassium  Date Value Ref Range Status  01/25/2023 4.3 3.5 - 5.1 mmol/L Final  03/26/2013 3.8 3.5 - 5.1 mmol/L Final         Passed - Patient is not pregnant      Passed - Last BP in normal range    BP Readings from Last 1 Encounters:  07/10/23 110/78         Passed - Valid encounter within last 6 months    Recent Outpatient Visits           1 month ago Anxiety   [facility] Rockney Cid, DO   3 months ago 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, without long-term current use of insulin , unspecified retinopathy severity [facility])   [facility] Rockney Cid, DO               JARDIANCE  25 MG TABS tablet [Pharmacy Med Name: JARDIANCE  TAB 25MG ] 90 tablet 0    Sig: TAKE 1 TABLET DAILY BEFORE BREAKFAST     Endocrinology:  Diabetes - SGLT2 Inhibitors Failed - 08/28/2023 12:56 PM      Failed - HBA1C is between 0 and 7.9 and within 180 days    Hemoglobin A1C  Date Value Ref Range Status  05/05/2023 10.4 (A) 4.0 - 5.6 % Final   HB A1C (BAYER DCA - WAIVED)  Date Value Ref Range Status  05/07/2015 >14.0 (H) <7.0 % Final    Comment:                                          Diabetic Adult            <7.0                                       Healthy Adult        4.3 - 5.7                                                            (DCCT/NGSP) American Diabetes Association's Summary of Glycemic Recommendations for Adults with Diabetes: Hemoglobin A1c <7.0%. More stringent glycemic goals (A1c <6.0%) may further reduce complications at the cost of increased risk of hypoglycemia.    Hgb A1c MFr Bld  Date Value  Ref Range Status  09/13/2022 8.6 (H) <5.7 % of total Hgb Final    Comment:    For someone without known diabetes, a hemoglobin A1c value of 6.5% or greater indicates that they may have  diabetes and this should be confirmed with a follow-up  test. . For someone with known diabetes, a value <7% indicates  that their diabetes is well controlled and a value  greater than or equal to 7% indicates suboptimal  control. A1c targets should be individualized based on  duration of diabetes, age, comorbid conditions, and  other considerations. . Currently, no consensus exists regarding use of hemoglobin A1c for diagnosis of diabetes for children. .          Passed - Cr in normal range and within 360 days    Creat  Date Value Ref Range Status  09/13/2022 0.98 0.50 - 1.03 mg/dL Final   Creatinine, Ser  Date Value Ref Range Status  01/25/2023 0.83 0.44 - 1.00 mg/dL Final   Creatinine, Urine  Date Value Ref Range Status  03/29/2022 5.0  Final         Passed - eGFR in normal range and within 360 days    GFR, Est African American  Date Value Ref Range Status  11/07/2018 103 > OR = 60 mL/min/1.56m2 Final   GFR, Est Non African American  Date Value Ref Range Status  11/07/2018 89 > OR = 60 mL/min/1.28m2 Final   GFR, Estimated  Date Value Ref Range Status  01/25/2023 >60 >60 mL/min Final    Comment:    (NOTE) Calculated using the CKD-EPI Creatinine Equation (2021)    eGFR  Date Value Ref Range Status  09/13/2022 68 > OR = 60 mL/min/1.72m2 Final         Passed - Valid encounter within last 6 months    Recent Outpatient Visits           1  month ago Anxiety   [facility] Rockney Cid, DO   3 months ago 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

## 2023-08-30 ENCOUNTER — Encounter: Payer: Self-pay | Admitting: Internal Medicine

## 2023-08-30 DIAGNOSIS — F419 Anxiety disorder, unspecified: Secondary | ICD-10-CM

## 2023-08-30 MED ORDER — BUPROPION HCL ER (XL) 150 MG PO TB24: 150.0000 mg | ORAL_TABLET | Freq: Every day | ORAL | 0 refills | Status: DC

## 2023-09-14 ENCOUNTER — Telehealth (INDEPENDENT_AMBULATORY_CARE_PROVIDER_SITE_OTHER): Admitting: Internal Medicine

## 2023-09-14 ENCOUNTER — Other Ambulatory Visit: Payer: Self-pay

## 2023-09-14 ENCOUNTER — Encounter: Payer: Self-pay | Admitting: Internal Medicine

## 2023-09-14 DIAGNOSIS — R059 Cough, unspecified: Secondary | ICD-10-CM

## 2023-09-14 DIAGNOSIS — F32A Depression, unspecified: Secondary | ICD-10-CM | POA: Diagnosis not present

## 2023-09-14 DIAGNOSIS — R058 Other specified cough: Secondary | ICD-10-CM

## 2023-09-14 DIAGNOSIS — F419 Anxiety disorder, unspecified: Secondary | ICD-10-CM | POA: Diagnosis not present

## 2023-09-14 DIAGNOSIS — M1990 Unspecified osteoarthritis, unspecified site: Secondary | ICD-10-CM

## 2023-09-14 MED ORDER — BUPROPION HCL ER (XL) 300 MG PO TB24
300.0000 mg | ORAL_TABLET | Freq: Every day | ORAL | 1 refills | Status: DC
Start: 2023-09-14 — End: 2023-11-16

## 2023-09-14 NOTE — Progress Notes (Signed)
 Virtual Visit via Video Note  I connected with Breanna Rogers on 09/14/23 at  1:20 PM EDT by a video enabled telemedicine application and verified that I am speaking with the correct person using two identifiers.  Location: Patient: Home Provider: [facility]   I discussed the limitations of evaluation and management by telemedicine and the availability of in person appointments. The patient expressed understanding and agreed to proceed.  History of Present Illness:  Patient is presenting via telemedicine to discuss new Wellbutrin  prescription.   Discussed the use of AI scribe software for clinical note transcription with the patient, who gave verbal consent to proceed.  History of Present Illness  Mrs. Breanna Rogers is a 58 year old female who presents for medication refills.  She is taking Celebrex for arthritis, which effectively manages her inflammatory pain without side effects. She recently started Wellbutrin  150 mg extended release and has not experienced any side effects, including sleep disturbances but is interested in increasing the dose. She is also on Zoloft  100 mg twice daily, which she tolerates well. For renal protection, she is on lisinopril  and Jardiance . She has a chronic dry cough, possibly related to lisinopril  use, with no hemoptysis.    Observations/Objective:  General: well appearing, no acute distress ENT: conjunctiva normal appearing bilaterally  Neuro: answers all questions appropriately   Assessment and Plan:  Assessment & Plan  Arthritis Chronic inflammatory arthritis managed with Celebrex, effective but with renal and gastrointestinal risks. Celebrex preferred over other NSAIDs for lower side effects. Alternatives include Voltaren gel and Tylenol . Ibuprofen shares similar side effects with Celebrex. Advised taking anti-inflammatories with food. - Consider Voltaren gel for pain management. - Use Tylenol  for pain, noting it does not reduce inflammation. -  Use ibuprofen sparingly and with food.  Depression/Anxiety Managed with Wellbutrin  and Zoloft . Wellbutrin  increased to 300 mg with no side effects. Zoloft  maintained at 200 mg daily, maximum dose for optimal management. - Increase Wellbutrin  to 300 mg. - Continue Zoloft  at 200 mg daily.  Chronic Cough Potential side effect of lisinopril , known to cause chronic dry cough. Monitoring to ascertain causality. - Monitor cough and consider discontinuing lisinopril  if persistent.  Diabetes Mellitus Requires follow-up for diabetic management, including A1c recheck. - Schedule A1c recheck within 1-3 months.  General Health Maintenance Routine follow-up and medication management, ensuring refills and monitoring for side effects. - Ref[facility] Wellbutrin  at [facility]. - Ensure refills for other medications as needed  Follow Up Instructions: 2 months in person    I discussed the assessment and treatment plan with the patient. The patient was provided an opportunity to ask questions and all were answered. The patient agreed with the plan and demonstrated an understanding of the instructions.   The patient was advised to call back or seek an in-person evaluation if the symptoms worsen or if the condition fails to improve as anticipated.  I provided 12 minutes of non-face-to-face time during this encounter.   Rockney Cid, DO

## 2023-09-19 ENCOUNTER — Encounter: Payer: Self-pay | Admitting: Internal Medicine

## 2023-09-28 ENCOUNTER — Other Ambulatory Visit: Payer: Self-pay | Admitting: Internal Medicine

## 2023-09-28 DIAGNOSIS — E785 Hyperlipidemia, unspecified: Secondary | ICD-10-CM

## 2023-10-02 NOTE — Telephone Encounter (Signed)
 Requested medications are due for refill today.  yes  Requested medications are on the active medications list.  yes  Last refill. 04/03/2023 #90 1 rf  Future visit scheduled.   yes  Notes to clinic.  Labs are expired.    Requested Prescriptions  Pending Prescriptions Disp Refills   atorvastatin  (LIPITOR) 80 MG tablet [Pharmacy Med Name: ATORVASTATIN  TAB 80MG ] 90 tablet 1    Sig: TAKE 1 TABLET DAILY     Cardiovascular:  Antilipid - Statins Failed - 10/02/2023  2:13 PM      Failed - Lipid Panel in normal range within the last 12 months    Cholesterol, Total  Date Value Ref Range Status  05/07/2015 239 (H) 100 - 199 mg/dL Final   Cholesterol  Date Value Ref Range Status  09/13/2022 120 <200 mg/dL Final   LDL Cholesterol (Calc)  Date Value Ref Range Status  09/13/2022 53 mg/dL (calc) Final    Comment:    Reference range: <100 . Desirable range <100 mg/dL for primary prevention;   <70 mg/dL for patients with CHD or diabetic patients  with > or = 2 CHD risk factors. [facility] LDL-C is now calculated using the Martin-Hopkins  calculation, which is a validated novel method providing  better accuracy than the Friedewald equation in the  estimation of LDL-C.  Gladis APPLETHWAITE et al. SANDREA. 7986;689(80): 2061-2068  (http://education.[facility].com/faq/FAQ164)    HDL  Date Value Ref Range Status  09/13/2022 38 (L) > OR = 50 mg/dL Final  97/90/7982 42 >60 mg/dL Final   Triglycerides  Date Value Ref Range Status  09/13/2022 248 (H) <150 mg/dL Final    Comment:    . If a non-fasting specimen was collected, consider repeat triglyceride testing on a fasting specimen if clinically indicated.  Veatrice et al. J. of Clin. Lipidol. 2015;9:129-169. SABRA          Passed - Patient is not pregnant      Passed - Valid encounter within last 12 months    Recent Outpatient Visits           2 weeks ago Anxiety   [facility] Bernardo Fend, DO   2 months ago  Anxiety   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

## 2023-11-16 ENCOUNTER — Other Ambulatory Visit: Payer: Self-pay

## 2023-11-16 ENCOUNTER — Ambulatory Visit (INDEPENDENT_AMBULATORY_CARE_PROVIDER_SITE_OTHER): Admitting: Internal Medicine

## 2023-11-16 ENCOUNTER — Encounter: Payer: Self-pay | Admitting: Internal Medicine

## 2023-11-16 ENCOUNTER — Other Ambulatory Visit: Payer: Self-pay | Admitting: Internal Medicine

## 2023-11-16 VITALS — BP 118/72 | HR 86 | Temp 98.1°F | Resp 16 | Ht 69.0 in | Wt 189.3 lb

## 2023-11-16 DIAGNOSIS — F419 Anxiety disorder, unspecified: Secondary | ICD-10-CM

## 2023-11-16 DIAGNOSIS — E11319 Type 2 diabetes mellitus with unspecified diabetic retinopathy without macular edema: Secondary | ICD-10-CM | POA: Diagnosis not present

## 2023-11-16 DIAGNOSIS — Z23 Encounter for immunization: Secondary | ICD-10-CM

## 2023-11-16 DIAGNOSIS — Z1231 Encounter for screening mammogram for malignant neoplasm of breast: Secondary | ICD-10-CM

## 2023-11-16 LAB — POCT GLYCOSYLATED HEMOGLOBIN (HGB A1C): Hemoglobin A1C: 7.6 % — AB (ref 4.0–5.6)

## 2023-11-16 MED ORDER — SERTRALINE HCL 100 MG PO TABS: 100.0000 mg | ORAL_TABLET | Freq: Two times a day (BID) | ORAL | 1 refills | Status: AC

## 2023-11-16 MED ORDER — LISINOPRIL 5 MG PO TABS
5.0000 mg | ORAL_TABLET | Freq: Every day | ORAL | 1 refills | Status: AC
Start: 2023-11-16 — End: ?

## 2023-11-16 MED ORDER — EMPAGLIFLOZIN 25 MG PO TABS: 25.0000 mg | ORAL_TABLET | Freq: Every day | ORAL | 1 refills | Status: AC

## 2023-11-16 NOTE — Progress Notes (Signed)
 Established Patient Office Visit  Subjective   Patient ID: Breanna Rogers, female    DOB: 10-16-1965  Age: 58 y.o. MRN: [id number]  Chief Complaint  Patient presents with   Diabetes    2 month follow up    Diabetes    Patient is in today for follow-up on chronic medical conditions.   Discussed the use of AI scribe software for clinical note transcription with the patient, who gave verbal consent to proceed.  History of Present Illness Breanna Rogers is a 58 year old female with type 2 diabetes who presents for diabetic management and follow-up.  Her blood glucose levels range between 150 and 160 mg/dL throughout the day. The last hemoglobin A1c was 10.4% in February. She is on Jardiance  for diabetes management and has experienced weight loss, which she attributes to her diabetes management efforts.  She is not taking Wellbutrin  or hydroxyzine , but she is on 200 mg of Zoloft . She experiences a metallic taste, particularly in the morning, with no blood in her sputum.  Current medications include Jardiance , lisinopril , atorvastatin , and Zoloft .    IBS: -GI symptoms have greatly improved since starting probiotics  Diabetes, Type 2: -Last A1c 10.4% 2/25 -Medications: Jardiance  25 mg, Lisinopril  5 mg -  -Failed medications: Ozempic caused pancreatitis, Trulicity causes nausea. Metformin  caused abdominal pain.  -Blood sugar running 150-160 -Eye exam: UTD 10/24 - retinopathy, following with ophthalmology -Foot exam: Due -Microalbumin: Due  -Statin: yes -PNA vaccine: UTD -Denies symptoms of hypoglycemia, polyuria, polydipsia, numbness extremities, foot ulcers/trauma.  Anxiety: -Currently on Zoloft  200 mg and Wellbutrin  300 mg XL but stopped the Wellbutrin  - did not feel any effect from it  Health maintenance: -Blood work up-to-date -Lung cancer screening 10/24, lung-rads 2 -Mammogram up-to-date 9/24  Patient Active Problem List   Diagnosis Date Noted    Eosinophilia 06/01/2018   Polyp of sigmoid colon    Abdominal aortic atherosclerosis (HCC) 09/19/2017   Fusion of spine of cervical region 06/22/2017   Breast calcification, left 05/10/2017   Abnormal ECG 04/28/2017   Hx of acute pancreatitis 06/06/2016   Hyperproteinemia 04/05/2016   Urine test positive for microalbuminuria 07/08/2015   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   Dyslipidemia 06/12/2015   Hx of iron deficiency anemia 05/07/2015   Fecal incontinence 05/07/2015   Diabetes mellitus type 2 with retinopathy (HCC)    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HCC)    Anxiety 10/05/2012   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   Past Medical History:  Diagnosis Date   Abdominal aortic atherosclerosis (HCC) 09/19/2017   Diabetes mellitus type 2 with retinopathy (HCC)    Elevated transaminase level    Hot flashes    Hyperlipidemia    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HCC) 4/15, 10/15   seen every 6 months   Numbness of foot    Obesity    Pancreatitis    Spasm of muscle    Vitamin D  deficiency    Past Surgical History:  Procedure Laterality Date   CERVICAL FUSION  2013   C5-7 at [facility], (ACDF C5-6 with removal of hardware)   CERVICAL FUSION  07/23/2017   C3-C7   COLONOSCOPY WITH PROPOFOL  N/A 12/15/2017   Procedure: COLONOSCOPY WITH PROPOFOL  with biopsy;  Surgeon: Jinny Carmine, MD;  Location: [facility];  Service: Endoscopy;  Laterality: N/A;  Diabetic - oral meds   POLYPECTOMY N/A 12/15/2017   Procedure: POLYPECTOMY;  Surgeon: Jinny Carmine, MD;  Location: [facility];  Service: Endoscopy;  Laterality: N/A;   TONSILLECTOMY AND ADENOIDECTOMY  1976   TUBAL LIGATION  1990   Social History   Tobacco Use   Smoking status: Former    Current packs/day: 0.00    Average packs/day: 1 pack/day for 35.0 years (35.0 ttl pk-yrs)    Types: Cigarettes    Start date: 03/29/1975    Quit date: 03/28/2010    Years since  quitting: 13.6   Smokeless tobacco: Never  Vaping Use   Vaping status: Never Used  Substance Use Topics   Alcohol use: No   Drug use: No   Social History   Socioeconomic History   Marital status: Married    Spouse name: Not on file   Number of children: Not on file   Years of education: Not on file   Highest education level: Some college, no degree  Occupational History   Not on file  Tobacco Use   Smoking status: Former    Current packs/day: 0.00    Average packs/day: 1 pack/day for 35.0 years (35.0 ttl pk-yrs)    Types: Cigarettes    Start date: 03/29/1975    Quit date: 03/28/2010    Years since quitting: 13.6   Smokeless tobacco: Never  Vaping Use   Vaping status: Never Used  Substance and Sexual Activity   Alcohol use: No   Drug use: No   Sexual activity: Not Currently  Other Topics Concern   Not on file  Social History Narrative   Not on file   Social Drivers of Health   Financial Resource Strain: Low Risk  (11/09/2023)   Overall Financial Resource Strain (CARDIA)    Difficulty of Paying Living Expenses: Not hard at all  Food Insecurity: No Food Insecurity (11/09/2023)   Hunger Vital Sign    Worried About Running Out of Food in the Last Year: Never true    Ran Out of Food in the Last Year: Never true  Transportation Needs: No Transportation Needs (11/09/2023)   PRAPARE - Administrator, Civil Service (Medical): No    Lack of Transportation (Non-Medical): No  Physical Activity: Inactive (11/09/2023)   Exercise Vital Sign    Days of Exercise per Week: 2 days    Minutes of Exercise per Session: 0 min  Stress: Stress Concern Present (11/09/2023)   [employer] of Occupational Health - Occupational Stress Questionnaire    Feeling of Stress: Rather much  Social Connections: Socially Integrated (11/09/2023)   Social Connection and Isolation Panel    Frequency of Communication with Friends and Family: More than three times a week    Frequency of Social  Gatherings with Friends and Family: More than three times a week    Attends Religious Services: More than 4 times per year    Active Member of [employer] or Organizations: Yes    Attends Banker Meetings: More than 4 times per year    Marital Status: Married  Catering manager Violence: Not At Risk (10/14/2022)   Humiliation, Afraid, Rape, and Kick questionnaire    Fear of Current or Ex-Partner: No    Emotionally Abused: No    Physically Abused: No    Sexually Abused: No   Family Status  Relation Name Status   Mother  Deceased at age 26       cervical cancer   Father  Deceased at age 69       heart attack   Sister older Alive   Brother  oldest Deceased       DM   Sister older Alive   Daughter  Alive   Son  Alive   MGM  Deceased       old age   MGF  Deceased       olda age   PGM  Deceased       old age   PGF  Deceased       heart attack   Sister oldest Deceased       heart attack   Brother  Deceased       MVA   Neg Hx  (Not Specified)  No partnership data on file   Family History  Problem Relation Age of Onset   Cancer Mother        cervical   Asthma Mother    Diabetes Mother    Heart disease Mother    Hyperlipidemia Mother    Heart disease Father    Hyperlipidemia Father    Hypertension Father    Heart attack Father    Thyroid disease Sister    Cancer Sister    Diabetes Brother    Hypertension Brother    Diabetes Sister    Heart attack Paternal Grandfather    Diabetes Sister    Heart disease Sister    Heart attack Sister    COPD Sister    Stroke Neg Hx    Breast cancer Neg Hx    Allergies  Allergen Reactions   Buspar  [Buspirone ] Other (See Comments)    suicidal ideation   Jardiance  [Empagliflozin ] Other (See Comments)    Pancreatitis episodes x 2    Ozempic (0.25 Or 0.5 Mg-Dose) [Semaglutide(0.25 Or 0.5mg -Dos)]     Pancreatitis    Trulicity [Dulaglutide] Diarrhea   Sulfa Antibiotics Rash      Review of Systems  Gastrointestinal:   Negative for abdominal pain, constipation, diarrhea, heartburn, nausea and vomiting.  All other systems reviewed and are negative.     Objective:     BP 118/72 (Cuff Size: Large)   Pulse 86   Temp 98.1 F (36.7 C) (Oral)   Resp 16   Ht 5' 9 (1.753 m)   Wt 189 lb 4.8 oz (85.9 kg)   SpO2 94%   BMI 27.95 kg/m  BP Readings from Last 3 Encounters:  11/16/23 118/72  07/10/23 110/78  05/05/23 120/72   Wt Readings from Last 3 Encounters:  11/16/23 189 lb 4.8 oz (85.9 kg)  07/10/23 196 lb 4.8 oz (89 kg)  05/05/23 198 lb 14.4 oz (90.2 kg)      Physical Exam Constitutional:      Appearance: Normal appearance.  HENT:     Head: Normocephalic and atraumatic.  Eyes:     Conjunctiva/sclera: Conjunctivae normal.  Cardiovascular:     Rate and Rhythm: Normal rate and regular rhythm.     Pulses:          Dorsalis pedis pulses are 2+ on the right side and 2+ on the left side.  Pulmonary:     Effort: Pulmonary effort is normal.     Breath sounds: Normal breath sounds.  Musculoskeletal:     Right foot: Normal range of motion. No deformity, bunion, Charcot foot, foot drop or prominent metatarsal heads.     Left foot: Normal range of motion. No deformity, bunion, Charcot foot, foot drop or prominent metatarsal heads.  Feet:     Right foot:     Protective Sensation: 6 sites tested.  6  sites sensed.     Skin integrity: Skin integrity normal.     Toenail Condition: Right toenails are normal.     Left foot:     Protective Sensation: 6 sites tested.       Skin integrity: Skin integrity normal.     Toenail Condition: Left toenails are normal.  Skin:    General: Skin is warm and dry.  Neurological:     General: No focal deficit present.     Mental Status: She is alert. Mental status is at baseline.  Psychiatric:        Mood and Affect: Mood normal.        Behavior: Behavior normal.      No results found for any visits on 11/16/23.  Last CBC Lab Results  Component Value Date    WBC 8.2 01/25/2023   HGB 13.6 01/25/2023   HCT 41.8 01/25/2023   MCV 91.9 01/25/2023   MCH 29.9 01/25/2023   RDW 13.3 01/25/2023   PLT 230 01/25/2023   Last metabolic panel Lab Results  Component Value Date   GLUCOSE 134 (H) 01/25/2023   NA 138 01/25/2023   K 4.3 01/25/2023   CL 101 01/25/2023   CO2 29 01/25/2023   BUN 19 01/25/2023   CREATININE 0.83 01/25/2023   GFRNONAA >60 01/25/2023   CALCIUM  9.7 01/25/2023   PHOS 4.1 09/11/2017   PROT 8.1 01/25/2023   ALBUMIN 4.8 01/25/2023   LABGLOB 3.0 05/07/2015   AGRATIO 1.5 05/07/2015   BILITOT 1.0 01/25/2023   ALKPHOS 73 01/25/2023   AST 19 01/25/2023   ALT 22 01/25/2023   ANIONGAP 8 01/25/2023   Last lipids Lab Results  Component Value Date   CHOL 120 09/13/2022   HDL 38 (L) 09/13/2022   LDLCALC 53 09/13/2022   TRIG 248 (H) 09/13/2022   CHOLHDL 3.2 09/13/2022   Last hemoglobin A1c Lab Results  Component Value Date   HGBA1C 10.4 (A) 05/05/2023   Last thyroid functions Lab Results  Component Value Date   TSH 1.48 09/11/2017   Last vitamin D  Lab Results  Component Value Date   VD25OH 37 09/11/2017   Last vitamin B12 and Folate No results found for: VITAMINB12, FOLATE    The ASCVD Risk score (Arnett DK, et al., 2019) failed to calculate for the following reasons:   The valid total cholesterol range is 130 to 320 mg/dL    Assessment & Plan:   Assessment & Plan Type 2 diabetes mellitus Type 2 diabetes mellitus with improved A1c from 10.4% to 7.6%. Blood glucose levels stable at 150-160 mg/dL. Current management effective, insulin  not required. - Continue Jardiance . - Continue current lifestyle modifications. - Perform diabetic foot exam. - Perform urine test. - Schedule labs in October. - Recheck A1c in November. - If A1c remains controlled, extend follow-up to every six months.  Major depressive disorder Major depressive disorder managed with Zoloft  200 mg. Wellbutrin  discontinued. Hydroxyzine   prescribed for anxiety, patient unaware of its use. Discussed potential for Genesight testing to optimize SSRI therapy. - Continue Zoloft  200 mg. - Discontinue Wellbutrin . - Educate on hydroxyzine  use for anxiety. - Consider Genesight testing at next visit.  - POCT HgB A1C - HM Diabetes Foot Exam - Urine Microalbumin w/creat. ratio - empagliflozin  (JARDIANCE ) 25 MG TABS tablet; Take 1 tablet (25 mg total) by mouth daily before breakfast.  Dispense: 90 tablet; Refill: 1 - lisinopril  (ZESTRIL ) 5 MG tablet; Take 1 tablet (5 mg total) by mouth daily.  Dispense:  90 tablet; Refill: 1 - sertraline  (ZOLOFT ) 100 MG tablet; Take 1 tablet (100 mg total) by mouth 2 (two) times daily.  Dispense: 180 tablet; Refill: 1 - Tdap vaccine greater than or equal to 7yo IM   Return in about 3 months (around 02/16/2024) for follow up on a1c.    Sharyle Fischer, DO

## 2023-11-17 ENCOUNTER — Ambulatory Visit: Payer: Self-pay | Admitting: Internal Medicine

## 2023-11-17 LAB — MICROALBUMIN / CREATININE URINE RATIO
Creatinine, Urine: 57 mg/dL (ref 20–275)
Microalb Creat Ratio: 18 mg/g{creat} (ref <30)
Microalb, Ur: 1 mg/dL

## 2023-12-29 ENCOUNTER — Encounter

## 2024-01-10 ENCOUNTER — Other Ambulatory Visit

## 2024-01-31 ENCOUNTER — Ambulatory Visit
Admission: RE | Admit: 2024-01-31 | Discharge: 2024-01-31 | Disposition: A | Discharge status: Home / Self Care | Source: Ambulatory Visit | Attending: Internal Medicine | Admitting: Internal Medicine

## 2024-01-31 ENCOUNTER — Inpatient Hospital Stay
Admission: RE | Admit: 2024-01-31 | Discharge: 2024-01-31 | Disposition: A | Source: Ambulatory Visit | Attending: Acute Care | Admitting: Acute Care

## 2024-01-31 DIAGNOSIS — Z1231 Encounter for screening mammogram for malignant neoplasm of breast: Secondary | ICD-10-CM | POA: Insufficient documentation

## 2024-01-31 DIAGNOSIS — Z87891 Personal history of nicotine dependence: Secondary | ICD-10-CM

## 2024-01-31 DIAGNOSIS — Z122 Encounter for screening for malignant neoplasm of respiratory organs: Secondary | ICD-10-CM

## 2024-02-02 ENCOUNTER — Ambulatory Visit: Payer: Self-pay | Admitting: Internal Medicine

## 2024-02-06 ENCOUNTER — Other Ambulatory Visit: Payer: Self-pay

## 2024-02-06 DIAGNOSIS — Z87891 Personal history of nicotine dependence: Secondary | ICD-10-CM

## 2024-02-06 DIAGNOSIS — Z122 Encounter for screening for malignant neoplasm of respiratory organs: Secondary | ICD-10-CM

## 2024-02-19 ENCOUNTER — Other Ambulatory Visit: Payer: Self-pay

## 2024-02-19 ENCOUNTER — Ambulatory Visit: Admitting: Internal Medicine

## 2024-02-19 ENCOUNTER — Encounter: Payer: Self-pay | Admitting: Internal Medicine

## 2024-02-19 VITALS — BP 124/82 | HR 84 | Temp 98.1°F | Resp 16 | Ht 69.0 in | Wt 193.4 lb

## 2024-02-19 DIAGNOSIS — I10 Essential (primary) hypertension: Secondary | ICD-10-CM

## 2024-02-19 DIAGNOSIS — E1165 Type 2 diabetes mellitus with hyperglycemia: Secondary | ICD-10-CM | POA: Diagnosis not present

## 2024-02-19 DIAGNOSIS — I2510 Atherosclerotic heart disease of native coronary artery without angina pectoris: Secondary | ICD-10-CM | POA: Diagnosis not present

## 2024-02-19 DIAGNOSIS — F419 Anxiety disorder, unspecified: Secondary | ICD-10-CM

## 2024-02-19 DIAGNOSIS — Z7984 Long term (current) use of oral hypoglycemic drugs: Secondary | ICD-10-CM

## 2024-02-19 DIAGNOSIS — E785 Hyperlipidemia, unspecified: Secondary | ICD-10-CM

## 2024-02-19 DIAGNOSIS — Z0001 Encounter for general adult medical examination with abnormal findings: Secondary | ICD-10-CM | POA: Diagnosis not present

## 2024-02-19 DIAGNOSIS — Z0000 Encounter for general adult medical examination without abnormal findings: Secondary | ICD-10-CM

## 2024-02-19 DIAGNOSIS — I700 Atherosclerosis of aorta: Secondary | ICD-10-CM

## 2024-02-19 DIAGNOSIS — K1379 Other lesions of oral mucosa: Secondary | ICD-10-CM

## 2024-02-19 MED ORDER — LIDOCAINE VISCOUS HCL 2 % MT SOLN
5.0000 mL | Freq: Four times a day (QID) | OROMUCOSAL | 0 refills | Status: AC
  Filled 2024-02-19: qty 360, 18d supply, fill #0

## 2024-02-19 MED ORDER — HYDROXYZINE HCL 10 MG PO TABS: 10.0000 mg | ORAL_TABLET | Freq: Every evening | ORAL | 0 refills | Status: AC | PRN

## 2024-02-19 NOTE — Progress Notes (Signed)
 Established Patient Office Visit  Subjective   Patient ID: Breanna Rogers, female    DOB: 08-12-1965  Age: 58 y.o. MRN: [id number]  Chief Complaint  Patient presents with   Medical Management of Chronic Issues    3 month recheck    Diabetes    Patient is in today for follow-up on chronic medical conditions. She is here with her husband.  Discussed the use of AI scribe software for clinical note transcription with the patient, who gave verbal consent to proceed.  History of Present Illness  Breanna Rogers is a 58 year old female who presents for a follow-up visit and annual physical exam.  For the past few weeks she has had soreness on her tongue and a spot on the roof of her mouth. Symptoms fluctuate, are worse in the evening, and persisted after she stopped chewable zinc vitamin C and switched to pill form. She has not been eating many acidic foods.  Her diabetes is treated with Jardiance . Home blood sugars have been stable around 130 mg/dL. Her last A1c was 7.6%.  She takes Lipitor for cholesterol and her last cholesterol levels were well controlled. She has aortic atherosclerosis and no exertional chest pain.  She takes hydroxyzine  at night for anxiety and requests a refill. She is also on lisinopril  and sertraline  without current problems.  She has not had an eye exam this year and is looking for a new eye doctor due to insurance changes. She has not had a flu vaccine but has received pneumonia and tetanus vaccines.   IBS: -GI symptoms have greatly improved since starting probiotics  Diabetes, Type 2: -Last A1c 7.6% 8/25 -Medications: Jardiance  25 mg, Lisinopril  5 mg  -Failed medications: Ozempic caused pancreatitis, Trulicity causes nausea. Metformin  caused abdominal pain.  -Blood sugar running 150-160 -Eye exam: UTD 10/24 - retinopathy, following with ophthalmology -Foot exam: UTD -Microalbumin: Due  -Statin: yes -PNA vaccine: UTD -Denies symptoms of  hypoglycemia, polyuria, polydipsia, numbness extremities, foot ulcers/trauma.  Anxiety: -Currently on Zoloft  200 mg and Wellbutrin  300 mg XL but stopped the Wellbutrin  - did not feel any effect from it  Health maintenance: -Blood work due -Lung cancer screening 11/25, lung-rads 2 with advanced CAD -Mammogram up-to-date 11/25 Birads-1  Patient Active Problem List   Diagnosis Date Noted   Eosinophilia 06/01/2018   Polyp of sigmoid colon    Abdominal aortic atherosclerosis 09/19/2017   Fusion of spine of cervical region 06/22/2017   Breast calcification, left 05/10/2017   Abnormal ECG 04/28/2017   Hx of acute pancreatitis 06/06/2016   Hyperproteinemia 04/05/2016   Urine test positive for microalbuminuria 07/08/2015   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   Dyslipidemia 06/12/2015   Hx of iron deficiency anemia 05/07/2015   Fecal incontinence 05/07/2015   Diabetes mellitus type 2 with retinopathy (HCC)    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HCC)    Anxiety 10/05/2012   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   Past Medical History:  Diagnosis Date   Abdominal aortic atherosclerosis 09/19/2017   Diabetes mellitus type 2 with retinopathy (HCC)    Elevated transaminase level    Hot flashes    Hyperlipidemia    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HCC) 4/15, 10/15   seen every 6 months   Numbness of foot    Obesity    Pancreatitis    Spasm of muscle    Vitamin D  deficiency    Past Surgical History:  Procedure Laterality Date  CERVICAL FUSION  2013   C5-7 at [facility], (ACDF C5-6 with removal of hardware)   CERVICAL FUSION  07/23/2017   C3-C7   COLONOSCOPY WITH PROPOFOL  N/A 12/15/2017   Procedure: COLONOSCOPY WITH PROPOFOL  with biopsy;  Surgeon: Jinny Carmine, MD;  Location: [facility];  Service: Endoscopy;  Laterality: N/A;  Diabetic - oral meds   POLYPECTOMY N/A 12/15/2017   Procedure: POLYPECTOMY;  Surgeon: Jinny Carmine, MD;   Location: [facility];  Service: Endoscopy;  Laterality: N/A;   TONSILLECTOMY AND ADENOIDECTOMY  1976   TUBAL LIGATION  1990   Social History   Tobacco Use   Smoking status: Former    Current packs/day: 0.00    Average packs/day: 1 pack/day for 35.0 years (35.0 ttl pk-yrs)    Types: Cigarettes    Start date: 03/29/1975    Quit date: 03/28/2010    Years since quitting: 13.9   Smokeless tobacco: Never  Vaping Use   Vaping status: Never Used  Substance Use Topics   Alcohol use: No   Drug use: No   Social History   Socioeconomic History   Marital status: Married    Spouse name: Not on file   Number of children: Not on file   Years of education: Not on file   Highest education level: GED or equivalent  Occupational History   Not on file  Tobacco Use   Smoking status: Former    Current packs/day: 0.00    Average packs/day: 1 pack/day for 35.0 years (35.0 ttl pk-yrs)    Types: Cigarettes    Start date: 03/29/1975    Quit date: 03/28/2010    Years since quitting: 13.9   Smokeless tobacco: Never  Vaping Use   Vaping status: Never Used  Substance and Sexual Activity   Alcohol use: No   Drug use: No   Sexual activity: Not Currently  Other Topics Concern   Not on file  Social History Narrative   Not on file   Social Drivers of Health   Financial Resource Strain: Low Risk  (02/15/2024)   Overall Financial Resource Strain (CARDIA)    Difficulty of Paying Living Expenses: Not hard at all  Food Insecurity: No Food Insecurity (02/15/2024)   Hunger Vital Sign    Worried About Running Out of Food in the Last Year: Never true    Ran Out of Food in the Last Year: Never true  Transportation Needs: No Transportation Needs (02/15/2024)   PRAPARE - Administrator, Civil Service (Medical): No    Lack of Transportation (Non-Medical): No  Physical Activity: Insufficiently Active (02/15/2024)   Exercise Vital Sign    Days of Exercise per Week: 4 days    Minutes of  Exercise per Session: 20 min  Stress: No Stress Concern Present (02/15/2024)   [employer] of Occupational Health - Occupational Stress Questionnaire    Feeling of Stress: Not at all  Social Connections: Socially Integrated (02/15/2024)   Social Connection and Isolation Panel    Frequency of Communication with Friends and Family: More than three times a week    Frequency of Social Gatherings with Friends and Family: More than three times a week    Attends Religious Services: More than 4 times per year    Active Member of [employer] or Organizations: Yes    Attends Banker Meetings: More than 4 times per year    Marital Status: Married  Catering Manager Violence: Not At Risk (10/14/2022)   Humiliation,  Afraid, Rape, and Kick questionnaire    Fear of Current or Ex-Partner: No    Emotionally Abused: No    Physically Abused: No    Sexually Abused: No   Family Status  Relation Name Status   Mother  Deceased at age 38       cervical cancer   Father  Deceased at age 23       heart attack   Sister older Alive   Brother oldest Deceased       DM   Sister older Alive   Daughter  Alive   Son  Alive   MGM  Deceased       old age   MGF  Deceased       olda age   PGM  Deceased       old age   PGF  Deceased       heart attack   Sister oldest Deceased       heart attack   Brother  Deceased       MVA   Neg Hx  (Not Specified)  No partnership data on file   Family History  Problem Relation Age of Onset   Cancer Mother        cervical   Asthma Mother    Diabetes Mother    Heart disease Mother    Hyperlipidemia Mother    Heart disease Father    Hyperlipidemia Father    Hypertension Father    Heart attack Father    Thyroid disease Sister    Cancer Sister    Diabetes Brother    Hypertension Brother    Diabetes Sister    Heart attack Paternal Grandfather    Diabetes Sister    Heart disease Sister    Heart attack Sister    COPD Sister    Stroke Neg Hx     Breast cancer Neg Hx    Allergies  Allergen Reactions   Buspar  [Buspirone ] Other (See Comments)    suicidal ideation   Jardiance  [Empagliflozin ] Other (See Comments)    Pancreatitis episodes x 2    Ozempic (0.25 Or 0.5 Mg-Dose) [Semaglutide(0.25 Or 0.5mg -Dos)]     Pancreatitis    Trulicity [Dulaglutide] Diarrhea   Sulfa Antibiotics Rash      Review of Systems  Gastrointestinal:  Negative for abdominal pain, constipation, diarrhea, heartburn, nausea and vomiting.  All other systems reviewed and are negative.     Objective:     BP 124/82 (Cuff Size: Large)   Pulse 84   Temp 98.1 F (36.7 C) (Oral)   Resp 16   Ht 5' 9 (1.753 m)   Wt 193 lb 6.4 oz (87.7 kg)   SpO2 97%   BMI 28.56 kg/m  BP Readings from Last 3 Encounters:  02/19/24 124/82  11/16/23 118/72  07/10/23 110/78   Wt Readings from Last 3 Encounters:  02/19/24 193 lb 6.4 oz (87.7 kg)  11/16/23 189 lb 4.8 oz (85.9 kg)  07/10/23 196 lb 4.8 oz (89 kg)      Physical Exam Constitutional:      Appearance: Normal appearance.  HENT:     Head: Normocephalic and atraumatic.     Mouth/Throat:     Mouth: Mucous membranes are moist.     Pharynx: Oropharynx is clear.     Comments: Small erythematous lesions on the posterior aspect of tongue Eyes:     Conjunctiva/sclera: Conjunctivae normal.  Cardiovascular:     Rate and Rhythm: Normal rate  and regular rhythm.  Pulmonary:     Effort: Pulmonary effort is normal.     Breath sounds: Normal breath sounds.  Skin:    General: Skin is warm and dry.  Neurological:     General: No focal deficit present.     Mental Status: She is alert. Mental status is at baseline.  Psychiatric:        Mood and Affect: Mood normal.        Behavior: Behavior normal.      No results found for any visits on 02/19/24.  Last CBC Lab Results  Component Value Date   WBC 8.2 01/25/2023   HGB 13.6 01/25/2023   HCT 41.8 01/25/2023   MCV 91.9 01/25/2023   MCH 29.9 01/25/2023    RDW 13.3 01/25/2023   PLT 230 01/25/2023   Last metabolic panel Lab Results  Component Value Date   GLUCOSE 134 (H) 01/25/2023   NA 138 01/25/2023   K 4.3 01/25/2023   CL 101 01/25/2023   CO2 29 01/25/2023   BUN 19 01/25/2023   CREATININE 0.83 01/25/2023   GFRNONAA >60 01/25/2023   CALCIUM  9.7 01/25/2023   PHOS 4.1 09/11/2017   PROT 8.1 01/25/2023   ALBUMIN 4.8 01/25/2023   LABGLOB 3.0 05/07/2015   AGRATIO 1.5 05/07/2015   BILITOT 1.0 01/25/2023   ALKPHOS 73 01/25/2023   AST 19 01/25/2023   ALT 22 01/25/2023   ANIONGAP 8 01/25/2023   Last lipids Lab Results  Component Value Date   CHOL 120 09/13/2022   HDL 38 (L) 09/13/2022   LDLCALC 53 09/13/2022   TRIG 248 (H) 09/13/2022   CHOLHDL 3.2 09/13/2022   Last hemoglobin A1c Lab Results  Component Value Date   HGBA1C 7.6 (A) 11/16/2023   Last thyroid functions Lab Results  Component Value Date   TSH 1.48 09/11/2017   Last vitamin D  Lab Results  Component Value Date   VD25OH 37 09/11/2017   Last vitamin B12 and Folate No results found for: VITAMINB12, FOLATE    The ASCVD Risk score (Arnett DK, et al., 2019) failed to calculate for the following reasons:   The valid total cholesterol range is 130 to 320 mg/dL    Assessment & Plan:   Assessment & Plan  Adult Wellness Visit Annual wellness visit conducted. Labs are due for routine screening. Discussed the importance of regular screenings and vaccinations. - Ordered annual labs including kidney, liver, electrolytes, anemia, A1c, and cholesterol. - Referred to ophthalmology for diabetic eye exam. - Discussed shingles vaccine as a consideration.  Type 2 diabetes mellitus Diabetes management is ongoing with Jardiance . Recent A1c was 7.6, with a goal of 7.5 or below. Blood sugars have been well-controlled, with a recent reading of 130 mg/dL. - Ordered A1c test. - Continue Jardiance . - Plan for follow-up in 6 months if A1c is 7.5 or below.  Hyperlipidemia  and atherosclerosis of aorta and advanced coronary artery atherosclerosis Hyperlipidemia and atherosclerosis are managed with Lipitor. Recent imaging showed advanced coronary artery atherosclerosis. Discussed potential coronary calcium  score test to assess coronary artery disease extent. She prefers to know about potential risks to prevent future events. - Ordered cholesterol test. - Will consider coronary calcium  score test based on cholesterol results. - Continue Lipitor.  Essential hypertension Hypertension is managed with lisinopril . - Continue lisinopril .  Oral mucosal irritation Reports soreness in the mouth, particularly on the tongue and soft palate. No ulcers observed, but irritation is present. Possible irritation from cancer medication. Magic Mouthwash  prescribed to soothe and promote healing. - Prescribed Magic Mouthwash from [employer].  Anxiety disorder Managed with hydroxyzine , taken at night. - Refilled hydroxyzine .  - Lipid Profile - HgB A1c - Ambulatory referral to Ophthalmology - magic mouthwash (lidocaine , diphenhydrAMINE, alum & mag hydroxide) suspension; Swish and spit 5 mLs 4 (four) times daily.  Dispense: 360 mL; Refill: 0 - Comprehensive Metabolic Panel (CMET) - CBC w/Diff/Platelet - Lipid Profile - hydrOXYzine  (ATARAX ) 10 MG tablet; Take 1 tablet (10 mg total) by mouth at bedtime as needed for anxiety.  Dispense: 90 tablet; Refill: 0   Return in about 6 months (around 08/18/2024).    Sharyle Fischer, DO

## 2024-02-20 LAB — LIPID PANEL
Cholesterol: 155 mg/dL (ref <200)
HDL: 60 mg/dL (ref >=50)
LDL Cholesterol (Calc): 68 mg/dL
Non-HDL Cholesterol (Calc): 95 mg/dL (ref <130)
Total CHOL/HDL Ratio: 2.6 (calc) (ref <5.0)
Triglycerides: 197 mg/dL — ABNORMAL HIGH (ref <150)

## 2024-02-20 LAB — COMPREHENSIVE METABOLIC PANEL WITH GFR
AG Ratio: 1.8 (calc) (ref 1.0–2.5)
ALT: 23 U/L (ref 6–29)
AST: 18 U/L (ref 10–35)
Albumin: 4.5 g/dL (ref 3.6–5.1)
Alkaline phosphatase (APISO): 76 U/L (ref 37–153)
BUN: 19 mg/dL (ref 7–25)
CO2: 26 mmol/L (ref 20–32)
Calcium: 9.8 mg/dL (ref 8.6–10.4)
Chloride: 99 mmol/L (ref 98–110)
Creat: 0.86 mg/dL (ref 0.50–1.03)
Globulin: 2.5 g/dL (ref 1.9–3.7)
Glucose, Bld: 167 mg/dL — ABNORMAL HIGH (ref 65–99)
Potassium: 4.7 mmol/L (ref 3.5–5.3)
Sodium: 137 mmol/L (ref 135–146)
Total Bilirubin: 0.8 mg/dL (ref 0.2–1.2)
Total Protein: 7 g/dL (ref 6.1–8.1)
eGFR: 78 mL/min/1.73m2 (ref >=60)

## 2024-02-20 LAB — CBC WITH DIFFERENTIAL/PLATELET
Absolute Lymphocytes: 1638 {cells}/uL (ref 850–3900)
Absolute Monocytes: 371 {cells}/uL (ref 200–950)
Basophils Absolute: 91 {cells}/uL (ref 0–200)
Basophils Relative: 1.3 %
Eosinophils Absolute: 840 {cells}/uL — ABNORMAL HIGH (ref 15–500)
Eosinophils Relative: 12 %
HCT: 39.4 % (ref 35.9–46.0)
Hemoglobin: 13.3 g/dL (ref 11.7–15.5)
MCH: 31.1 pg (ref 27.0–33.0)
MCHC: 33.8 g/dL (ref 31.6–35.4)
MCV: 92.1 fL (ref 81.4–101.7)
MPV: 10.5 fL (ref 7.5–12.5)
Monocytes Relative: 5.3 %
Neutro Abs: 4060 {cells}/uL (ref 1500–7800)
Neutrophils Relative %: 58 %
Platelets: 190 Thousand/uL (ref 140–400)
RBC: 4.28 Million/uL (ref 3.80–5.10)
RDW: 12.9 % (ref 11.0–15.0)
Total Lymphocyte: 23.4 %
WBC: 7 Thousand/uL (ref 3.8–10.8)

## 2024-02-20 LAB — HEMOGLOBIN A1C
Hgb A1c MFr Bld: 8.4 % — ABNORMAL HIGH (ref <5.7)
Mean Plasma Glucose: 194 mg/dL
eAG (mmol/L): 10.8 mmol/L

## 2024-02-21 ENCOUNTER — Ambulatory Visit: Payer: Self-pay | Admitting: Internal Medicine

## 2024-02-27 ENCOUNTER — Encounter: Payer: Self-pay | Admitting: Internal Medicine

## 2024-02-28 LAB — OPHTHALMOLOGY REPORT-SCANNED

## 2024-03-25 ENCOUNTER — Other Ambulatory Visit: Payer: Self-pay | Admitting: Internal Medicine

## 2024-03-25 DIAGNOSIS — E785 Hyperlipidemia, unspecified: Secondary | ICD-10-CM

## 2024-03-26 NOTE — Telephone Encounter (Signed)
 Requested Prescriptions  Pending Prescriptions Disp Refills   atorvastatin  (LIPITOR) 80 MG tablet [Pharmacy Med Name: ATORVASTATIN  TAB 80MG ] 90 tablet 3    Sig: TAKE 1 TABLET DAILY     Cardiovascular:  Antilipid - Statins Failed - 03/26/2024  2:35 PM      Failed - Lipid Panel in normal range within the last 12 months    Cholesterol, Total  Date Value Ref Range Status  05/07/2015 239 (H) 100 - 199 mg/dL Final   Cholesterol  Date Value Ref Range Status  02/19/2024 155 <200 mg/dL Final   LDL Cholesterol (Calc)  Date Value Ref Range Status  02/19/2024 68 mg/dL (calc) Final    Comment:    Reference range: <100 . Desirable range <100 mg/dL for primary prevention;   <70 mg/dL for patients with CHD or diabetic patients  with > or = 2 CHD risk factors. SABRA LDL-C is now calculated using the Martin-Hopkins  calculation, which is a validated novel method providing  better accuracy than the Friedewald equation in the  estimation of LDL-C.  Gladis APPLETHWAITE et al. SANDREA. 7986;689(80): 2061-2068  (http://education.[facility].com/faq/FAQ164)    HDL  Date Value Ref Range Status  02/19/2024 60 > OR = 50 mg/dL Final  97/90/7982 42 >60 mg/dL Final   Triglycerides  Date Value Ref Range Status  02/19/2024 197 (H) <150 mg/dL Final         Passed - Patient is not pregnant      Passed - Valid encounter within last 12 months    Recent Outpatient Visits           1 month ago Type 2 diabetes mellitus with hyperglycemia, without long-term current use of insulin  [facility])   [place] [facility] Bernardo Fend, DO   4 months ago 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, without long-term current use of insulin , unspecified retinopathy severity [facility])   [facility] Bernardo Fend, DO   6 months ago Anxiety   [facility] Bernardo Fend, DO   8 months ago Anxiety   [facility] Bernardo Fend, DO   10 months ago 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

## 2024-08-21 ENCOUNTER — Ambulatory Visit: Admitting: Internal Medicine
